# Patient Record
Sex: Female | Born: 1980 | Race: White | Hispanic: No | Marital: Single | Smoking: Never smoker
Health system: Southern US, Community
[De-identification: ages and names within clinical notes are randomized; demographics above are authoritative.]

## PROBLEM LIST (undated history)

## (undated) ENCOUNTER — Emergency Department (HOSPITAL_COMMUNITY): Payer: Medicaid Other

## (undated) DIAGNOSIS — O1490 Unspecified pre-eclampsia, unspecified trimester: Secondary | ICD-10-CM

## (undated) DIAGNOSIS — O24419 Gestational diabetes mellitus in pregnancy, unspecified control: Secondary | ICD-10-CM

---

## 2014-07-11 ENCOUNTER — Encounter (HOSPITAL_COMMUNITY): Payer: Self-pay | Admitting: *Deleted

## 2014-07-11 ENCOUNTER — Emergency Department (HOSPITAL_COMMUNITY)
Admission: EM | Admit: 2014-07-11 | Discharge: 2014-07-11 | Disposition: A | Discharge status: Home / Self Care | Payer: Medicaid - Out of State | Attending: Emergency Medicine | Admitting: Emergency Medicine

## 2014-07-11 DIAGNOSIS — K006 Disturbances in tooth eruption: Secondary | ICD-10-CM | POA: Diagnosis not present

## 2014-07-11 DIAGNOSIS — K088 Other specified disorders of teeth and supporting structures: Secondary | ICD-10-CM | POA: Insufficient documentation

## 2014-07-11 DIAGNOSIS — K0889 Other specified disorders of teeth and supporting structures: Secondary | ICD-10-CM

## 2014-07-11 MED ORDER — TRAMADOL HCL 50 MG PO TABS: 50.0000 mg | ORAL_TABLET | Freq: Four times a day (QID) | ORAL | Status: DC | PRN

## 2014-07-11 MED ORDER — TRAMADOL HCL 50 MG PO TABS
50.0000 mg | ORAL_TABLET | Freq: Once | ORAL | Status: AC
  Administered 2014-07-11: 50 mg via ORAL
  Filled 2014-07-11: qty 1

## 2014-07-11 MED ORDER — AMOXICILLIN 500 MG PO CAPS: 500.0000 mg | ORAL_CAPSULE | Freq: Three times a day (TID) | ORAL | Status: AC

## 2014-07-11 NOTE — Discharge Instructions (Signed)
Dental Pain Toothache is pain in or around a tooth. It may get worse with chewing or with cold or heat.  HOME CARE  Your dentist may use a numbing medicine during treatment. If so, you may need to avoid eating until the medicine wears off. Ask your dentist about this.  Only take medicine as told by your dentist or doctor.  Avoid chewing food near the painful tooth until after all treatment is done. Ask your dentist about this. GET HELP RIGHT AWAY IF:   The problem gets worse or new problems appear.  You have a fever.  There is redness and puffiness (swelling) of the face, jaw, or neck.  You cannot open your mouth.  There is pain in the jaw.  There is very bad pain that is not helped by medicine. MAKE SURE YOU:   Understand these instructions.  Will watch your condition.  Will get help right away if you are not doing well or get worse. Document Released: 07/24/2007 Document Revised: 04/29/2011 Document Reviewed: 07/24/2007 [facility] Patient Information 2015 [place], [place]. This information is not intended to replace advice given to you by your health care provider. Make sure you discuss any questions you have with your health care provider.   As discussed there is no clear-cut infection as the source of your dental pain today.  Use the tramadol for pain relief.  If you develop increased pain especially associated with gingival swelling or drainage as discussed, get the antibiotic prescription filled as well.  See her oral surgeon as soon as possible for further management of your pain.

## 2014-07-11 NOTE — ED Notes (Signed)
Pt c/o right side dental pain that became worse a few days ago, has appointment with oral surgeon 08-11-2014

## 2014-07-11 NOTE — ED Provider Notes (Signed)
CSN: [id number]     Arrival date & time 07/11/14  1429 History   First MD Initiated Contact with Patient 07/11/14 1603     Chief Complaint  Patient presents with  . Dental Pain     (Consider location/radiation/quality/duration/timing/severity/associated sxs/prior Treatment) The history is provided by the patient.   Barbara Thompson is a 34 y.o. female presenting with a 3 day history of dental pain without gingival swelling.   The patient has a history of intermittent pain at the dental site and is [place] (here assisting family member).   There has been no fevers, chills, nausea or vomiting, also no complaint of difficulty swallowing, although chewing makes pain worse.  The patient has tried oragel and ibuprofen without relief of symptoms.        History reviewed. No pertinent past medical history. Past Surgical History  Procedure Laterality Date  . Cesarean section     No family history on file. History  Substance Use Topics  . Smoking status: Never Smoker   . Smokeless tobacco: Not on file  . Alcohol Use: No   OB History    No data available     Review of Systems  Constitutional: Negative for fever.  HENT: Positive for dental problem. Negative for facial swelling and sore throat.   Respiratory: Negative for shortness of breath.   Musculoskeletal: Negative for neck pain and neck stiffness.      Allergies  Review of patient's allergies indicates no known allergies.  Home Medications   Prior to Admission medications   Medication Sig Start Date End Date Taking? Authorizing Provider  benzocaine (ORAJEL) 10 % mucosal gel Use as directed 1 application in the mouth or throat as needed for mouth pain.   Yes Historical Provider, MD  ibuprofen (ADVIL,MOTRIN) 200 MG tablet Take 200 mg by mouth every 6 (six) hours as needed for mild pain or moderate pain.   Yes Historical Provider, MD  amoxicillin (AMOXIL) 500 MG capsule Take 1  capsule (500 mg total) by mouth 3 (three) times daily. 07/11/14 07/21/14  Burgess Amor, PA-C  traMADol (ULTRAM) 50 MG tablet Take 1 tablet (50 mg total) by mouth every 6 (six) hours as needed. 07/11/14   Burgess Amor, PA-C   BP 124/80 mmHg  Pulse 105  Temp(Src) 97.6 F (36.4 C) (Oral)  Resp 18  Ht  (1.549 m)  Wt 230 lb (104.327 kg)  BMI 43.48 kg/m2  SpO2 99% Physical Exam  Constitutional: She is oriented to person, place, and time. She appears well-developed and well-nourished. No distress.  HENT:  Head: Normocephalic and atraumatic.  Right Ear: Tympanic membrane and external ear normal.  Left Ear: Tympanic membrane and external ear normal.  Mouth/Throat: Oropharynx is clear and moist and mucous membranes are normal. No oral lesions. No trismus in the jaw. Abnormal dentition. No dental abscesses.  Patient has several areas of dental extractions involving molars.  Her right upper second molar is painful on percussion.  There is an old appearing filling in this tooth, no obvious abscess or dental decay, although I suspect possible filling failure at this site.  Eyes: Conjunctivae are normal.  Neck: Normal range of motion. Neck supple.  Cardiovascular: Normal rate and normal heart sounds.   Pulmonary/Chest: Effort normal.  Abdominal: She exhibits no distension.  Musculoskeletal: Normal range of motion.  Lymphadenopathy:    She has no cervical adenopathy.  Neurological: She is alert and oriented to person,  place, and time.  Skin: Skin is warm and dry. No erythema.  Psychiatric: She has a normal mood and affect.    ED Course  Procedures (including critical care time) Labs Review Labs Reviewed - No data to display  Imaging Review No results found.   EKG Interpretation None      MDM   Final diagnoses:  Pain, dental    Patient was prescribed tramadol for pain relief.  I'm not convinced she has an active infection at this tooth site, however she was prescribed amoxicillin and  advised to just hold this prescription and get it filled if her pain persists and she develops gingival swelling at the site.  She reports history of prior dental abscesses and agrees this does not feel like an abscess today.  She is planning follow-up with her oral surgeon for next week.  When necessary follow-up anticipated here.    Burgess AmorJulie Jasleen Riepe, PA-C 07/11/14 1802  Gilda Creasehristopher J Pollina, MD 07/11/14 [phone]

## 2018-08-12 ENCOUNTER — Emergency Department (HOSPITAL_COMMUNITY)
Admission: EM | Admit: 2018-08-12 | Discharge: 2018-08-12 | Disposition: A | Discharge status: Home / Self Care | Payer: Medicaid - Out of State | Attending: Emergency Medicine | Admitting: Emergency Medicine

## 2018-08-12 ENCOUNTER — Other Ambulatory Visit: Payer: Self-pay

## 2018-08-12 ENCOUNTER — Encounter (HOSPITAL_COMMUNITY): Payer: Self-pay | Admitting: Emergency Medicine

## 2018-08-12 ENCOUNTER — Emergency Department (HOSPITAL_COMMUNITY): Payer: Medicaid - Out of State

## 2018-08-12 DIAGNOSIS — R1032 Left lower quadrant pain: Secondary | ICD-10-CM | POA: Diagnosis not present

## 2018-08-12 DIAGNOSIS — Z3490 Encounter for supervision of normal pregnancy, unspecified, unspecified trimester: Secondary | ICD-10-CM

## 2018-08-12 DIAGNOSIS — R102 Pelvic and perineal pain: Secondary | ICD-10-CM | POA: Diagnosis not present

## 2018-08-12 DIAGNOSIS — Z79899 Other long term (current) drug therapy: Secondary | ICD-10-CM | POA: Diagnosis not present

## 2018-08-12 DIAGNOSIS — N3000 Acute cystitis without hematuria: Secondary | ICD-10-CM

## 2018-08-12 DIAGNOSIS — O2341 Unspecified infection of urinary tract in pregnancy, first trimester: Secondary | ICD-10-CM | POA: Insufficient documentation

## 2018-08-12 DIAGNOSIS — O9989 Other specified diseases and conditions complicating pregnancy, childbirth and the puerperium: Secondary | ICD-10-CM | POA: Insufficient documentation

## 2018-08-12 DIAGNOSIS — R109 Unspecified abdominal pain: Secondary | ICD-10-CM

## 2018-08-12 LAB — CBC WITH DIFFERENTIAL/PLATELET
Abs Immature Granulocytes: 0.03 10*3/uL (ref 0.00–0.07)
Basophils Absolute: 0 10*3/uL (ref 0.0–0.1)
Basophils Relative: 0 %
Eosinophils Absolute: 0.1 10*3/uL (ref 0.0–0.5)
Eosinophils Relative: 1 %
HCT: 38.3 % (ref 36.0–46.0)
Hemoglobin: 12.3 g/dL (ref 12.0–15.0)
Immature Granulocytes: 0 %
Lymphocytes Relative: 24 %
Lymphs Abs: 2.1 10*3/uL (ref 0.7–4.0)
MCH: 26.5 pg (ref 26.0–34.0)
MCHC: 32.1 g/dL (ref 30.0–36.0)
MCV: 82.4 fL (ref 80.0–100.0)
Monocytes Absolute: 0.5 10*3/uL (ref 0.1–1.0)
Monocytes Relative: 6 %
Neutro Abs: 5.9 10*3/uL (ref 1.7–7.7)
Neutrophils Relative %: 69 %
Platelets: 178 10*3/uL (ref 150–400)
RBC: 4.65 MIL/uL (ref 3.87–5.11)
RDW: 14.2 % (ref 11.5–15.5)
WBC: 8.6 10*3/uL (ref 4.0–10.5)
nRBC: 0 % (ref 0.0–0.2)

## 2018-08-12 LAB — URINALYSIS, ROUTINE W REFLEX MICROSCOPIC
Bilirubin Urine: NEGATIVE
Glucose, UA: NEGATIVE mg/dL
Hgb urine dipstick: NEGATIVE
Ketones, ur: NEGATIVE mg/dL
Nitrite: NEGATIVE
Protein, ur: NEGATIVE mg/dL
Specific Gravity, Urine: 1.023 (ref 1.005–1.030)
pH: 5 (ref 5.0–8.0)

## 2018-08-12 LAB — COMPREHENSIVE METABOLIC PANEL
ALT: 13 U/L (ref 0–44)
AST: 12 U/L — ABNORMAL LOW (ref 15–41)
Albumin: 3.5 g/dL (ref 3.5–5.0)
Alkaline Phosphatase: 55 U/L (ref 38–126)
Anion gap: 9 (ref 5–15)
BUN: 12 mg/dL (ref 6–20)
CO2: 23 mmol/L (ref 22–32)
Calcium: 8.7 mg/dL — ABNORMAL LOW (ref 8.9–10.3)
Chloride: 106 mmol/L (ref 98–111)
Creatinine, Ser: 0.62 mg/dL (ref 0.44–1.00)
GFR calc Af Amer: >60 mL/min (ref >60)
GFR calc non Af Amer: >60 mL/min (ref >60)
Glucose, Bld: 110 mg/dL — ABNORMAL HIGH (ref 70–99)
Potassium: 3.7 mmol/L (ref 3.5–5.1)
Sodium: 138 mmol/L (ref 135–145)
Total Bilirubin: 0.5 mg/dL (ref 0.3–1.2)
Total Protein: 6.1 g/dL — ABNORMAL LOW (ref 6.5–8.1)

## 2018-08-12 LAB — HCG, QUANTITATIVE, PREGNANCY: hCG, Beta Chain, Quant, S: 1621 m[IU]/mL — ABNORMAL HIGH (ref <5)

## 2018-08-12 LAB — ABO/RH: ABO/RH(D): A NEG

## 2018-08-12 LAB — PREGNANCY, URINE: Preg Test, Ur: POSITIVE — AB

## 2018-08-12 MED ORDER — CEPHALEXIN 500 MG PO CAPS: 500.0000 mg | ORAL_CAPSULE | Freq: Two times a day (BID) | ORAL | 0 refills | Status: DC

## 2018-08-12 NOTE — ED Provider Notes (Signed)
[facility] Provider Note   CSN: [id number] Arrival date & time: 08/12/18  [id number]    History   Chief Complaint Chief Complaint  Patient presents with  . Abdominal Pain    HPI Ewing SchleinSherry Knoedler is a 38 y.o. female.     Patient with history of preeclampsia and gestational diabetes, C-section history presents with lower abdominal cramping worse left lower for 1 day.  Patient has been trying to get pregnant and took a home pregnancy test that was positive.  Patient denies any vaginal discharge vaginal bleeding or concerns for STDs.  Patient denies any fevers or respiratory symptoms.  Patient has no ectopic pregnancy history.  Patient denies urinary symptoms.     History reviewed. No pertinent past medical history.  There are no active problems to display for this patient.   Past Surgical History:  Procedure Laterality Date  . CESAREAN SECTION       OB History    Gravida  1   Para      Term      Preterm      AB      Living        SAB      TAB      Ectopic      Multiple      Live Births               Home Medications    Prior to Admission medications   Medication Sig Start Date End Date Taking? Authorizing Provider  Prenatal Vit-Fe Fumarate-FA (MULTIVITAMIN-PRENATAL) 27-0.8 MG TABS tablet Take 1 tablet by mouth daily at 12 noon.   Yes [provider]  cephALEXin (KEFLEX) 500 MG capsule Take 1 capsule (500 mg total) by mouth 2 (two) times daily. 08/12/18   Blane OharaZavitz, Maki Hege, MD  traMADol (ULTRAM) 50 MG tablet Take 1 tablet (50 mg total) by mouth every 6 (six) hours as needed. Patient not taking: Reported on 08/12/2018 07/11/14   Burgess AmorIdol, Julie, PA-C    Family History No family history on file.  Social History Social History   Tobacco Use  . Smoking status: Never Smoker  . Smokeless tobacco: Never Used  Substance Use Topics  . Alcohol use: No  . Drug use: No     Allergies   Patient has no known allergies.   Review of  Systems Review of Systems  Constitutional: Positive for appetite change. Negative for chills and fever.  HENT: Negative for congestion.   Eyes: Negative for visual disturbance.  Respiratory: Negative for shortness of breath.   Cardiovascular: Negative for chest pain.  Gastrointestinal: Positive for abdominal pain. Negative for vomiting.  Genitourinary: Negative for dysuria and flank pain.  Musculoskeletal: Negative for back pain, neck pain and neck stiffness.  Skin: Negative for rash.  Neurological: Negative for light-headedness and headaches.     Physical Exam Updated Vital Signs BP (!) 90/51   Pulse (!) 55   Temp 97.6 F (36.4 C) (Oral)   Ht 5\' 1"  (1.549 m)   Wt 98.9 kg   SpO2 100%   BMI 41.19 kg/m   Physical Exam Vitals signs and nursing note reviewed.  Constitutional:      Appearance: She is well-developed.  HENT:     Head: Normocephalic and atraumatic.  Eyes:     General:        Right eye: No discharge.        Left eye: No discharge.     Conjunctiva/sclera: Conjunctivae normal.  Neck:     Musculoskeletal: Normal range of motion and neck supple.     Trachea: No tracheal deviation.  Cardiovascular:     Rate and Rhythm: Normal rate and regular rhythm.  Pulmonary:     Effort: Pulmonary effort is normal.     Breath sounds: Normal breath sounds.  Abdominal:     General: There is no distension.     Palpations: Abdomen is soft.     Tenderness: There is no abdominal tenderness. There is no guarding.  Skin:    General: Skin is warm.     Findings: No rash.  Neurological:     Mental Status: She is alert and oriented to person, place, and time.      ED Treatments / Results  Labs (all labs ordered are listed, but only abnormal results are displayed) Labs Reviewed  HCG, QUANTITATIVE, PREGNANCY - Abnormal; Notable for the following components:      Result Value   hCG, Beta Chain, Quant, S 1,621 (*)    All other components within normal limits  URINALYSIS,  ROUTINE W REFLEX MICROSCOPIC - Abnormal; Notable for the following components:   Leukocytes,Ua TRACE (*)    Bacteria, UA RARE (*)    All other components within normal limits  COMPREHENSIVE METABOLIC PANEL - Abnormal; Notable for the following components:   Glucose, Bld 110 (*)    Calcium 8.7 (*)    Total Protein 6.1 (*)    AST 12 (*)    All other components within normal limits  PREGNANCY, URINE - Abnormal; Notable for the following components:   Preg Test, Ur POSITIVE (*)    All other components within normal limits  CBC WITH DIFFERENTIAL/PLATELET  ABO/RH    EKG None  Radiology US Ob Comp < 14 Wks  Result Date: 08/12/2018 CLINICAL DATA:  Pelvic pain for 1 day EXAM: OBSTETRIC <14 WK [place] AND TRANSVAGINAL OB US TECHNIQUE: Both transabdominal and transvaginal ultrasound examinations were performed for complete evaluation of the gestation as well as the maternal uterus, adnexal regions, and pelvic cul-de-sac. Transvaginal technique was performed to assess early pregnancy. COMPARISON:  None. FINDINGS: Intrauterine gestational sac: None Yolk sac:  Not Visualized. Embryo:  Not Visualized. Cardiac Activity: Not Visualized. Subchorionic hemorrhage:  None visualized. Maternal uterus/adnexae: No adnexal mass. Normal bilateral ovaries. Normal uterus. No pelvic free fluid. IMPRESSION: No intrauterine pregnancy identified. No beta hCG is available for correlation. If the beta hCG is elevated, differential diagnosis includes pregnancy too early to detect versus missed abortion versus ectopic pregnancy. Recommend clinical correlation, serial quantitative beta HCGs, ectopic precautions, and followup ultrasound as clinically indicated. Electronically Signed   By: Kathreen Devoid   On: 08/12/2018 09:45   US Ob Transvaginal  Result Date: 08/12/2018 CLINICAL DATA:  Pelvic pain for 1 day EXAM: OBSTETRIC <14 WK [place] AND TRANSVAGINAL OB US TECHNIQUE: Both transabdominal and transvaginal ultrasound examinations were  performed for complete evaluation of the gestation as well as the maternal uterus, adnexal regions, and pelvic cul-de-sac. Transvaginal technique was performed to assess early pregnancy. COMPARISON:  None. FINDINGS: Intrauterine gestational sac: None Yolk sac:  Not Visualized. Embryo:  Not Visualized. Cardiac Activity: Not Visualized. Subchorionic hemorrhage:  None visualized. Maternal uterus/adnexae: No adnexal mass. Normal bilateral ovaries. Normal uterus. No pelvic free fluid. IMPRESSION: No intrauterine pregnancy identified. No beta hCG is available for correlation. If the beta hCG is elevated, differential diagnosis includes pregnancy too early to detect versus missed abortion versus ectopic pregnancy. Recommend clinical correlation, serial quantitative  beta HCGs, ectopic precautions, and followup ultrasound as clinically indicated. Electronically Signed   By: Elige KoHetal  Patel   On: 08/12/2018 09:45    Procedures Procedures (including critical care time)  Medications Ordered in ED Medications - No data to display   Initial Impression / Assessment and Plan / ED Course  I have reviewed the triage vital signs and the nursing notes.  Pertinent labs & imaging results that were available during my care of the patient were reviewed by me and considered in my medical decision making (see chart for details).       Patient presents with abdominal cramping early in pregnancy.  Unknown dating of pregnancy and with age and discomfort plan for ultrasound to confirm IUP.  Blood work sent, urinalysis pending.  Ultrasound reviewed no definitive intrauterine pregnancy.  Beta hCG low 1621.  Concern for very early pregnancy versus ectopic.  Discussed with Dr. Despina HiddenEure who will see the patient on Monday.  Specific reasons to return follow-up discussed with patient.  Urinalysis possible early infection culture sent to plan for oral antibiotics. Final Clinical Impressions(s) / ED Diagnoses   Final diagnoses:  Early  stage of pregnancy  Abdominal cramping  Acute cystitis without hematuria    ED Discharge Orders         Ordered    cephALEXin (KEFLEX) 500 MG capsule  2 times daily     08/12/18 1113           Blane OharaZavitz, Aseel [place], MD 08/12/18 1115

## 2018-08-12 NOTE — Discharge Instructions (Addendum)
Follow-up with OB on Monday for repeat blood testing and reassessment. If you develop vaginal bleeding, worsening pain, passout or other concerns return to the emergency department. Take Tylenol as needed every 4-6 hours for pain.  Take antibiotics until urine culture result.

## 2018-08-12 NOTE — ED Notes (Signed)
Pt refused d/c vitals. Pt urgent to leave to go check on daughter.

## 2018-08-12 NOTE — ED Triage Notes (Addendum)
Pt c/o of lower abdominal pain x 1 day, denies n/v/d or urinary s/s. Pt is currently pregnant.  Doesn't know how far along

## 2018-08-13 LAB — URINE CULTURE

## 2018-11-17 ENCOUNTER — Other Ambulatory Visit: Payer: Self-pay

## 2018-11-17 ENCOUNTER — Emergency Department (HOSPITAL_COMMUNITY)
Admission: EM | Admit: 2018-11-17 | Discharge: 2018-11-17 | Disposition: A | Discharge status: Home / Self Care | Payer: Medicaid - Out of State | Attending: Emergency Medicine | Admitting: Emergency Medicine

## 2018-11-17 ENCOUNTER — Encounter (HOSPITAL_COMMUNITY): Payer: Self-pay | Admitting: *Deleted

## 2018-11-17 DIAGNOSIS — Z3A18 18 weeks gestation of pregnancy: Secondary | ICD-10-CM | POA: Insufficient documentation

## 2018-11-17 DIAGNOSIS — O26892 Other specified pregnancy related conditions, second trimester: Secondary | ICD-10-CM | POA: Insufficient documentation

## 2018-11-17 DIAGNOSIS — K429 Umbilical hernia without obstruction or gangrene: Secondary | ICD-10-CM | POA: Insufficient documentation

## 2018-11-17 DIAGNOSIS — R1032 Left lower quadrant pain: Secondary | ICD-10-CM

## 2018-11-17 HISTORY — DX: Gestational diabetes mellitus in pregnancy, unspecified control: O24.419

## 2018-11-17 HISTORY — DX: Unspecified pre-eclampsia, unspecified trimester: O14.90

## 2018-11-17 MED ORDER — LACTATED RINGERS IV BOLUS: 1000.0000 mL | Freq: Once | INTRAVENOUS | Status: DC

## 2018-11-17 NOTE — ED Notes (Signed)
Pt stated she had to leave due to her mother being rushed to [facility]

## 2018-11-17 NOTE — ED Triage Notes (Signed)
Pt c/o abdominal pain and cramping x 3 days; pt states she has an umbilical hernia that she is usually able to push back in but since she has been pregnant she has been having difficulty with pushing it in; pt states the pain has been keeping her from sleeping at night

## 2018-11-17 NOTE — ED Provider Notes (Signed)
[facility] EMERGENCY DEPARTMENT Provider Note   CSN: [id number] Arrival date & time: 11/17/18  0214     History   Chief Complaint Chief Complaint  Patient presents with  . Abdominal Pain    HPI Barbara Thompson is a 38 y.o. female.     Patient is a periumbilical hernia that has been more difficult to reduce and slightly more painful than previously.  She still has normal bowel movements and no vomiting.  She also has some lower abdominal cramping and increasing urinary frequency.  She states that she had preeclampsia and gestational diabetes in her last pregnancy.  She is currently [redacted] weeks pregnant.  Still feels the baby move.  No vaginal leakage.  No other associated symptoms.  The history is provided by the patient.  Abdominal Pain Pain location:  Periumbilical, LLQ and RLQ   Past Medical History:  Diagnosis Date  . Gestational diabetes   . Preeclampsia     There are no active problems to display for this patient.   Past Surgical History:  Procedure Laterality Date  . CESAREAN SECTION       OB History    Gravida  1   Para      Term      Preterm      AB      Living        SAB      TAB      Ectopic      Multiple      Live Births               Home Medications    Prior to Admission medications   Medication Sig Start Date End Date Taking? Authorizing Provider  cephALEXin (KEFLEX) 500 MG capsule Take 1 capsule (500 mg total) by mouth 2 (two) times daily. 08/12/18   Blane Ohara, MD  Prenatal Vit-Fe Fumarate-FA (MULTIVITAMIN-PRENATAL) 27-0.8 MG TABS tablet Take 1 tablet by mouth daily at 12 noon.    [provider]  traMADol (ULTRAM) 50 MG tablet Take 1 tablet (50 mg total) by mouth every 6 (six) hours as needed. Patient not taking: Reported on 08/12/2018 07/11/14   Burgess Amor, PA-C    Family History History reviewed. No pertinent family history.  Social History Social History   Tobacco Use  . Smoking status: Never Smoker  .  Smokeless tobacco: Never Used  Substance Use Topics  . Alcohol use: No  . Drug use: No     Allergies   Patient has no known allergies.   Review of Systems Review of Systems  Gastrointestinal: Positive for abdominal pain.  All other systems reviewed and are negative.    Physical Exam Updated Vital Signs BP 123/70 (BP Location: Left Arm)   Pulse (!) 103   Temp 97.9 F (36.6 C) (Oral)   Resp 18   Ht 5\' 1"  (1.549 m)   Wt 97.5 kg   SpO2 99%   BMI 40.62 kg/m   Physical Exam Vitals signs and nursing note reviewed.  Constitutional:      Appearance: She is well-developed.  HENT:     Head: Normocephalic and atraumatic.     Nose: No congestion or rhinorrhea.     Mouth/Throat:     Pharynx: Oropharynx is clear.  Eyes:     Pupils: Pupils are equal, round, and reactive to light.  Neck:     Musculoskeletal: Normal range of motion.  Cardiovascular:     Rate and Rhythm: Normal rate and  regular rhythm.     Pulses: Normal pulses.  Pulmonary:     Effort: No respiratory distress.     Breath sounds: No stridor.  Abdominal:     General: There is no distension.     Tenderness: There is no abdominal tenderness.     Hernia: A hernia (easily reducible, minimally tender periumbilcal hernia) is present.  Musculoskeletal: Normal range of motion.  Skin:    General: Skin is warm and dry.  Neurological:     General: No focal deficit present.     Mental Status: She is alert.      ED Treatments / Results  Labs (all labs ordered are listed, but only abnormal results are displayed) Labs Reviewed  URINE CULTURE  URINALYSIS, ROUTINE W REFLEX MICROSCOPIC  CBC WITH DIFFERENTIAL/PLATELET    EKG None  Radiology No results found.  Procedures Procedures (including critical care time)  Medications Ordered in ED Medications  lactated ringers bolus 1,000 mL (has no administration in time range)     Initial Impression / Assessment and Plan / ED Course  I have reviewed the  triage vital signs and the nursing notes.  Pertinent labs & imaging results that were available during my care of the patient were reviewed by me and considered in my medical decision making (see chart for details).        Low suspicion for impending fetal demise.  Strong heart rate, good fetal movement per the mother and no vaginal symptoms.  Periumbilical hernia is not have any evidence of incarceration or pending bowel obstruction.  Her lower abdominal pain could be related to a UTI which she is had the past.  Her polyuria also could be related to diabetes.  Plan to check a urine, hCG and a BMP for the same.  Informed by nursing that patient left without telling anyone.  Final Clinical Impressions(s) / ED Diagnoses   Final diagnoses:  None    ED Discharge Orders    None       Laquan Ludden, Corene Cornea, MD 11/17/18 [phone]

## 2020-07-05 ENCOUNTER — Emergency Department (HOSPITAL_COMMUNITY): Payer: Medicaid Other

## 2020-07-05 ENCOUNTER — Emergency Department (HOSPITAL_COMMUNITY)
Admission: EM | Admit: 2020-07-05 | Discharge: 2020-07-05 | Disposition: A | Discharge status: Home / Self Care | Payer: Medicaid Other | Attending: Emergency Medicine | Admitting: Emergency Medicine

## 2020-07-05 ENCOUNTER — Other Ambulatory Visit: Payer: Self-pay

## 2020-07-05 ENCOUNTER — Encounter (HOSPITAL_COMMUNITY): Payer: Self-pay | Admitting: Emergency Medicine

## 2020-07-05 DIAGNOSIS — R110 Nausea: Secondary | ICD-10-CM | POA: Insufficient documentation

## 2020-07-05 DIAGNOSIS — R109 Unspecified abdominal pain: Secondary | ICD-10-CM | POA: Diagnosis present

## 2020-07-05 DIAGNOSIS — R300 Dysuria: Secondary | ICD-10-CM | POA: Diagnosis not present

## 2020-07-05 DIAGNOSIS — R10A0 Flank pain, unspecified side: Secondary | ICD-10-CM

## 2020-07-05 LAB — CBC WITH DIFFERENTIAL/PLATELET
Abs Immature Granulocytes: 0.02 10*3/uL (ref 0.00–0.07)
Basophils Absolute: 0 10*3/uL (ref 0.0–0.1)
Basophils Relative: 0 %
Eosinophils Absolute: 0 10*3/uL (ref 0.0–0.5)
Eosinophils Relative: 0 %
HCT: 40.2 % (ref 36.0–46.0)
Hemoglobin: 12.8 g/dL (ref 12.0–15.0)
Immature Granulocytes: 0 %
Lymphocytes Relative: 31 %
Lymphs Abs: 2 10*3/uL (ref 0.7–4.0)
MCH: 27.1 pg (ref 26.0–34.0)
MCHC: 31.8 g/dL (ref 30.0–36.0)
MCV: 85 fL (ref 80.0–100.0)
Monocytes Absolute: 0.4 10*3/uL (ref 0.1–1.0)
Monocytes Relative: 6 %
Neutro Abs: 4 10*3/uL (ref 1.7–7.7)
Neutrophils Relative %: 63 %
Platelets: 194 10*3/uL (ref 150–400)
RBC: 4.73 MIL/uL (ref 3.87–5.11)
RDW: 16.8 % — ABNORMAL HIGH (ref 11.5–15.5)
WBC: 6.4 10*3/uL (ref 4.0–10.5)
nRBC: 0 % (ref 0.0–0.2)

## 2020-07-05 LAB — URINALYSIS, ROUTINE W REFLEX MICROSCOPIC
Bilirubin Urine: NEGATIVE
Glucose, UA: NEGATIVE mg/dL
Ketones, ur: NEGATIVE mg/dL
Leukocytes,Ua: NEGATIVE
Nitrite: POSITIVE — AB
Protein, ur: 30 mg/dL — AB
Specific Gravity, Urine: 1.019 (ref 1.005–1.030)
pH: 5 (ref 5.0–8.0)

## 2020-07-05 LAB — BASIC METABOLIC PANEL
Anion gap: 9 (ref 5–15)
BUN: 8 mg/dL (ref 6–20)
CO2: 25 mmol/L (ref 22–32)
Calcium: 8.9 mg/dL (ref 8.9–10.3)
Chloride: 100 mmol/L (ref 98–111)
Creatinine, Ser: 0.56 mg/dL (ref 0.44–1.00)
GFR, Estimated: >60 mL/min (ref >60)
Glucose, Bld: 110 mg/dL — ABNORMAL HIGH (ref 70–99)
Potassium: 3.6 mmol/L (ref 3.5–5.1)
Sodium: 134 mmol/L — ABNORMAL LOW (ref 135–145)

## 2020-07-05 LAB — PREGNANCY, URINE: Preg Test, Ur: NEGATIVE

## 2020-07-05 MED ORDER — SODIUM CHLORIDE 0.9 % IV BOLUS
1000.0000 mL | Freq: Once | INTRAVENOUS | Status: AC
  Administered 2020-07-05: 1000 mL via INTRAVENOUS

## 2020-07-05 MED ORDER — MORPHINE SULFATE (PF) 4 MG/ML IV SOLN
4.0000 mg | Freq: Once | INTRAVENOUS | Status: AC
  Administered 2020-07-05: 4 mg via INTRAVENOUS
  Filled 2020-07-05: qty 1

## 2020-07-05 MED ORDER — ONDANSETRON HCL 4 MG/2ML IJ SOLN
4.0000 mg | Freq: Once | INTRAMUSCULAR | Status: AC
  Administered 2020-07-05: 4 mg via INTRAVENOUS
  Filled 2020-07-05: qty 2

## 2020-07-05 MED ORDER — KETOROLAC TROMETHAMINE 30 MG/ML IJ SOLN
15.0000 mg | Freq: Once | INTRAMUSCULAR | Status: AC
  Administered 2020-07-05: 15 mg via INTRAVENOUS
  Filled 2020-07-05: qty 1

## 2020-07-05 MED ORDER — CEPHALEXIN 500 MG PO CAPS: 500.0000 mg | ORAL_CAPSULE | Freq: Two times a day (BID) | ORAL | 0 refills | Status: AC

## 2020-07-05 NOTE — ED Provider Notes (Signed)
Care of the patient assumed at the change of shift. Here for R flank pain, possible kidney stone. Pending labs and CT.  Physical Exam  BP (!) 155/92   Pulse (!) 102   Temp 97.9 F (36.6 C) (Oral)   Resp 18   Ht 5\' 1"  (1.549 m)   Wt 93 kg   LMP 06/25/2020   SpO2 98%   Breastfeeding Unknown   BMI 38.73 kg/m   Physical Exam Awake and alert No distress Preoccupied with finding her phone ED Course/Procedures   Clinical Course as of 07/05/20 1055  Wed Jul 05, 2020  0730 CBC is normal.  [CS]  0735 HCG is neg. BMP is normal.  [CS]  0739 UA with small blood, nitrite positive. No other definite signs of infection although patient reports burning with urination and frequency. Will send for culture and treat for cystitis.  [CS]  1030 CT report is not in Epic, but from PACS: IMPRESSION: 1. Possible tiny nonobstructing right renal calculus. No evidence of ureteral calculus or hydronephrosis. 2. No apparent acute abdominal findings. 3. Moderate-sized umbilical hernia containing only fat. Lumbar facet arthropathy. [CS]    Clinical Course User Index [CS] Jul 07, 2020, MD    Procedures  MDM        Pollyann Savoy, MD 07/05/20 1055

## 2020-07-05 NOTE — ED Provider Notes (Signed)
[facility] EMERGENCY DEPARTMENT Provider Note   CSN: [id number] Arrival date & time: 07/05/20  0601     History Chief Complaint  Patient presents with  . Flank Pain    Barbara Thompson is a 40 y.o. female.  HPI     This a 40 year old female who presents with right-sided flank pain.  Patient reports 2 to 3-day history of worsening intermittent right-sided flank pain that radiates into the right abdomen.  She states it has progressively gotten worse.  She has also noted dark-colored urine in the last day.  She states that she stayed up most of the evening with her pain.  She did not take anything for her symptoms except for an Azo.  She is not had any fever.  She has had some slight dysuria.  She reports nausea without vomiting.  She reports "I may have had a kidney stone before" but she states that she feels that this is worse.  She rates her pain at 6 out of 10.  Past Medical History:  Diagnosis Date  . Gestational diabetes   . Preeclampsia     There are no problems to display for this patient.   Past Surgical History:  Procedure Laterality Date  . CESAREAN SECTION       OB History    Gravida  1   Para      Term      Preterm      AB      Living        SAB      IAB      Ectopic      Multiple      Live Births              No family history on file.  Social History   Tobacco Use  . Smoking status: Never Smoker  . Smokeless tobacco: Never Used  Vaping Use  . Vaping Use: Never used  Substance Use Topics  . Alcohol use: No    Comment: occ  . Drug use: No    Home Medications Prior to Admission medications   Medication Sig Start Date End Date Taking? Authorizing Provider  cephALEXin (KEFLEX) 500 MG capsule Take 1 capsule (500 mg total) by mouth 2 (two) times daily. 08/12/18   Blane Ohara, MD  Prenatal Vit-Fe Fumarate-FA (MULTIVITAMIN-PRENATAL) 27-0.8 MG TABS tablet Take 1 tablet by mouth daily at 12 noon.    [provider]   traMADol (ULTRAM) 50 MG tablet Take 1 tablet (50 mg total) by mouth every 6 (six) hours as needed. Patient not taking: Reported on 08/12/2018 07/11/14   Burgess Amor, PA-C    Allergies    Patient has no known allergies.  Review of Systems   Review of Systems  Constitutional: Negative for fever.  Respiratory: Negative for shortness of breath.   Cardiovascular: Negative for chest pain.  Gastrointestinal: Positive for nausea. Negative for abdominal pain and vomiting.  Genitourinary: Positive for dysuria and flank pain.  All other systems reviewed and are negative.   Physical Exam Updated Vital Signs BP (!) 155/92   Pulse (!) 102   Temp 98.2 F (36.8 C)   Resp 18   Ht 1.549 m (5\' 1" )   Wt 93 kg   LMP 06/25/2020   SpO2 98%   Breastfeeding Unknown   BMI 38.73 kg/m   Physical Exam Vitals and nursing note reviewed.  Constitutional:      Appearance: She is well-developed. She is obese.  She is not ill-appearing.  HENT:     Head: Normocephalic and atraumatic.     Nose: Nose normal.     Mouth/Throat:     Mouth: Mucous membranes are moist.  Eyes:     Pupils: Pupils are equal, round, and reactive to light.  Cardiovascular:     Rate and Rhythm: Normal rate and regular rhythm.     Heart sounds: Normal heart sounds.  Pulmonary:     Effort: Pulmonary effort is normal. No respiratory distress.     Breath sounds: No wheezing.  Abdominal:     General: Bowel sounds are normal.     Palpations: Abdomen is soft.     Tenderness: There is right CVA tenderness. There is no left CVA tenderness.  Musculoskeletal:     Cervical back: Neck supple.     Right lower leg: No edema.     Left lower leg: No edema.  Skin:    General: Skin is warm and dry.  Neurological:     Mental Status: She is alert and oriented to person, place, and time.  Psychiatric:        Mood and Affect: Mood normal.     ED Results / Procedures / Treatments   Labs (all labs ordered are listed, but only abnormal  results are displayed) Labs Reviewed  CBC WITH DIFFERENTIAL/PLATELET  BASIC METABOLIC PANEL  URINALYSIS, ROUTINE W REFLEX MICROSCOPIC  PREGNANCY, URINE    EKG None  Radiology No results found.  Procedures Procedures   Medications Ordered in ED Medications  sodium chloride 0.9 % bolus 1,000 mL (1,000 mLs Intravenous New Bag/Given 07/05/20 0649)  ketorolac (TORADOL) 30 MG/ML injection 15 mg (15 mg Intravenous Given 07/05/20 0650)  morphine 4 MG/ML injection 4 mg (4 mg Intravenous Given 07/05/20 0650)  ondansetron (ZOFRAN) injection 4 mg (4 mg Intravenous Given 07/05/20 8099)    ED Course  I have reviewed the triage vital signs and the nursing notes.  Pertinent labs & imaging results that were available during my care of the patient were reviewed by me and considered in my medical decision making (see chart for details).    MDM Rules/Calculators/A&P                          Patient presents with flank pain.  Worsening over the last few hours.  She is overall nontoxic and vital signs are notable for pulse of 102 and blood pressure 155/92.  She is afebrile.  She has some right CVA tenderness.  History is most consistent with kidney stone.  UTI is also a consideration.  Labs obtained.  Patient was given fluids, pain, and nausea medication.  There is no imaging in our system to confirm prior history of kidney stones.  Will obtain CT stone study after confirming that she is not pregnant.  Patient to be signed out to oncoming provider.   Final Clinical Impression(s) / ED Diagnoses Final diagnoses:  Flank pain    Rx / DC Orders ED Discharge Orders    None       Aedan Geimer, Mayer Masker, MD 07/05/20 [phone]

## 2020-07-05 NOTE — ED Notes (Signed)
Pt is very anxious and talkative 

## 2020-07-05 NOTE — ED Notes (Signed)
Right flank pain x 1 day, similar episode 10 years ago with a kidney stone.

## 2020-07-05 NOTE — ED Triage Notes (Signed)
Pt c/o right sided flank pain x 2 days. Pt states that her urine is a dark brown as well

## 2020-07-07 LAB — URINE CULTURE: Culture: >=100000 — AB

## 2020-09-23 ENCOUNTER — Emergency Department (HOSPITAL_COMMUNITY)
Admission: EM | Admit: 2020-09-23 | Discharge: 2020-09-23 | Disposition: A | Discharge status: Home / Self Care | Payer: Medicaid Other | Attending: Emergency Medicine | Admitting: Emergency Medicine

## 2020-09-23 ENCOUNTER — Emergency Department (HOSPITAL_COMMUNITY): Payer: Medicaid Other

## 2020-09-23 ENCOUNTER — Other Ambulatory Visit: Payer: Self-pay

## 2020-09-23 ENCOUNTER — Encounter (HOSPITAL_COMMUNITY): Payer: Self-pay | Admitting: *Deleted

## 2020-09-23 DIAGNOSIS — E871 Hypo-osmolality and hyponatremia: Secondary | ICD-10-CM | POA: Insufficient documentation

## 2020-09-23 DIAGNOSIS — M545 Low back pain, unspecified: Secondary | ICD-10-CM | POA: Diagnosis not present

## 2020-09-23 DIAGNOSIS — R109 Unspecified abdominal pain: Secondary | ICD-10-CM

## 2020-09-23 DIAGNOSIS — Z87442 Personal history of urinary calculi: Secondary | ICD-10-CM | POA: Diagnosis not present

## 2020-09-23 DIAGNOSIS — K429 Umbilical hernia without obstruction or gangrene: Secondary | ICD-10-CM | POA: Diagnosis not present

## 2020-09-23 LAB — URINALYSIS, ROUTINE W REFLEX MICROSCOPIC
Bilirubin Urine: NEGATIVE
Glucose, UA: NEGATIVE mg/dL
Hgb urine dipstick: NEGATIVE
Ketones, ur: 5 mg/dL — AB
Leukocytes,Ua: NEGATIVE
Nitrite: NEGATIVE
Protein, ur: 30 mg/dL — AB
Specific Gravity, Urine: 1.03 (ref 1.005–1.030)
pH: 5 (ref 5.0–8.0)

## 2020-09-23 LAB — COMPREHENSIVE METABOLIC PANEL
ALT: 28 U/L (ref 0–44)
AST: 64 U/L — ABNORMAL HIGH (ref 15–41)
Albumin: 4.1 g/dL (ref 3.5–5.0)
Alkaline Phosphatase: 66 U/L (ref 38–126)
Anion gap: 5 (ref 5–15)
BUN: 13 mg/dL (ref 6–20)
CO2: 26 mmol/L (ref 22–32)
Calcium: 8.4 mg/dL — ABNORMAL LOW (ref 8.9–10.3)
Chloride: 103 mmol/L (ref 98–111)
Creatinine, Ser: 0.67 mg/dL (ref 0.44–1.00)
GFR, Estimated: >60 mL/min (ref >60)
Glucose, Bld: 92 mg/dL (ref 70–99)
Potassium: 3.2 mmol/L — ABNORMAL LOW (ref 3.5–5.1)
Sodium: 134 mmol/L — ABNORMAL LOW (ref 135–145)
Total Bilirubin: 0.6 mg/dL (ref 0.3–1.2)
Total Protein: 7.4 g/dL (ref 6.5–8.1)

## 2020-09-23 LAB — CBC WITH DIFFERENTIAL/PLATELET
Abs Immature Granulocytes: 0.01 10*3/uL (ref 0.00–0.07)
Basophils Absolute: 0 10*3/uL (ref 0.0–0.1)
Basophils Relative: 0 %
Eosinophils Absolute: 0 10*3/uL (ref 0.0–0.5)
Eosinophils Relative: 0 %
HCT: 39.1 % (ref 36.0–46.0)
Hemoglobin: 13 g/dL (ref 12.0–15.0)
Immature Granulocytes: 0 %
Lymphocytes Relative: 13 %
Lymphs Abs: 0.7 10*3/uL (ref 0.7–4.0)
MCH: 26.9 pg (ref 26.0–34.0)
MCHC: 33.2 g/dL (ref 30.0–36.0)
MCV: 80.8 fL (ref 80.0–100.0)
Monocytes Absolute: 0.5 10*3/uL (ref 0.1–1.0)
Monocytes Relative: 9 %
Neutro Abs: 4.3 10*3/uL (ref 1.7–7.7)
Neutrophils Relative %: 78 %
Platelets: 153 10*3/uL (ref 150–400)
RBC: 4.84 MIL/uL (ref 3.87–5.11)
RDW: 13.6 % (ref 11.5–15.5)
WBC: 5.6 10*3/uL (ref 4.0–10.5)
nRBC: 0 % (ref 0.0–0.2)

## 2020-09-23 LAB — PREGNANCY, URINE: Preg Test, Ur: NEGATIVE

## 2020-09-23 MED ORDER — CYCLOBENZAPRINE HCL 10 MG PO TABS: 10.0000 mg | ORAL_TABLET | Freq: Two times a day (BID) | ORAL | 0 refills | Status: DC | PRN

## 2020-09-23 MED ORDER — SODIUM CHLORIDE 0.9 % IV BOLUS
500.0000 mL | Freq: Once | INTRAVENOUS | Status: AC
  Administered 2020-09-23: 500 mL via INTRAVENOUS

## 2020-09-23 MED ORDER — ONDANSETRON HCL 4 MG/2ML IJ SOLN
4.0000 mg | Freq: Once | INTRAMUSCULAR | Status: AC
  Administered 2020-09-23: 4 mg via INTRAVENOUS
  Filled 2020-09-23: qty 2

## 2020-09-23 MED ORDER — CEPHALEXIN 500 MG PO CAPS: 500.0000 mg | ORAL_CAPSULE | Freq: Two times a day (BID) | ORAL | 0 refills | Status: AC

## 2020-09-23 MED ORDER — MORPHINE SULFATE (PF) 4 MG/ML IV SOLN
4.0000 mg | Freq: Once | INTRAVENOUS | Status: AC
  Administered 2020-09-23: 4 mg via INTRAVENOUS
  Filled 2020-09-23: qty 1

## 2020-09-23 NOTE — ED Provider Notes (Signed)
[facility] EMERGENCY DEPARTMENT Provider Note   CSN: [id number] Arrival date & time: 09/23/20  1840     History Chief Complaint  Patient presents with   Flank Pain    Barbara Thompson is a 40 y.o. female.  HPI  Patient with no significant medical history presents to the emergency department with chief complaint of right-sided flank pain, patient states this started 2 days ago, states the pain came on suddenly, she describes the pain as a sharp stabbing-like sensation, will wax and wane in intensity, she states she feels it radiating into her groin, she had associated nausea without vomiting, she denies urinary symptoms like dysuria, hematuria, frequency, difficulty urination, she denies vaginal discharge, vaginal bleeding, pelvic pain, she denies any constipation or diarrhea.  She states that she has had a kidney stone in the past, states this feels similar to that, has no other significant abdominal history, she denies alleviating or aggravating factors at this time.  She does not endorse fevers, chills, chest pain, shortness of breath.  Past Medical History:  Diagnosis Date   Gestational diabetes    Preeclampsia     There are no problems to display for this patient.   Past Surgical History:  Procedure Laterality Date   CESAREAN SECTION       OB History     Gravida  1   Para      Term      Preterm      AB      Living         SAB      IAB      Ectopic      Multiple      Live Births              History reviewed. No pertinent family history.  Social History   Tobacco Use   Smoking status: Never   Smokeless tobacco: Never  Vaping Use   Vaping Use: Never used  Substance Use Topics   Alcohol use: No    Comment: occ   Drug use: No    Home Medications Prior to Admission medications   Medication Sig Start Date End Date Taking? Authorizing Provider  cephALEXin (KEFLEX) 500 MG capsule Take 1 capsule (500 mg total) by mouth 2 (two) times daily for  7 days. 09/23/20 09/30/20 Yes Carroll Sage, PA-C  cyclobenzaprine (FLEXERIL) 10 MG tablet Take 1 tablet (10 mg total) by mouth 2 (two) times daily as needed for muscle spasms. 09/23/20  Yes Carroll Sage, PA-C  diphenhydrAMINE-PSE-APAP (BENADRYL ALLERGY/COLD PO) Take 2 tablets by mouth every 6 (six) hours as needed (allergies).    [provider]  Phenazopyridine HCl (AZO DINE MAXIMUM STRENGTH PO) Take 1 capsule by mouth daily as needed (for bladder).    [provider]    Allergies    Patient has no known allergies.  Review of Systems   Review of Systems  Constitutional:  Negative for chills and fever.  HENT:  Negative for congestion.   Respiratory:  Negative for shortness of breath.   Cardiovascular:  Negative for chest pain.  Gastrointestinal:  Positive for nausea. Negative for abdominal pain and vomiting.  Genitourinary:  Positive for flank pain. Negative for dysuria, enuresis, vaginal bleeding, vaginal discharge and vaginal pain.  Musculoskeletal:  Negative for back pain.  Skin:  Negative for rash.  Neurological:  Negative for dizziness.  Hematological:  Does not bruise/bleed easily.   Physical Exam Updated Vital Signs BP 126/65  Pulse 96   Temp 97.9 F (36.6 C) (Oral)   Resp 19   Ht 5\' 1"  (1.549 m)   Wt 90.3 kg   LMP 08/25/2020   SpO2 99%   BMI 37.60 kg/m   Physical Exam Vitals and nursing note reviewed.  Constitutional:      General: She is not in acute distress.    Appearance: She is not ill-appearing.  HENT:     Head: Normocephalic and atraumatic.     Nose: No congestion.  Eyes:     Conjunctiva/sclera: Conjunctivae normal.  Cardiovascular:     Rate and Rhythm: Normal rate and regular rhythm.     Pulses: Normal pulses.     Heart sounds: No murmur heard.   No friction rub. No gallop.  Pulmonary:     Effort: No respiratory distress.     Breath sounds: No wheezing, rhonchi or rales.  Abdominal:     Palpations: Abdomen is soft.      Tenderness: There is no abdominal tenderness. There is right CVA tenderness. There is no left CVA tenderness.     Comments: Abdomen was visualized she has a noted umbilical hernia easily reducible, abdomen nondistended, normal bowel sounds, dull to percussion, nontender to palpation, she has noted right-sided flank pain with positive CVA tenderness.  Musculoskeletal:     Right lower leg: No edema.     Left lower leg: No edema.     Comments: Spine was palpated she had tenderness to palpation along her lumbar spine no step-off deformities present.  Skin:    General: Skin is warm and dry.  Neurological:     Mental Status: She is alert.  Psychiatric:        Mood and Affect: Mood normal.    ED Results / Procedures / Treatments   Labs (all labs ordered are listed, but only abnormal results are displayed) Labs Reviewed  COMPREHENSIVE METABOLIC PANEL - Abnormal; Notable for the following components:      Result Value   Sodium 134 (*)    Potassium 3.2 (*)    Calcium 8.4 (*)    AST 64 (*)    All other components within normal limits  URINALYSIS, ROUTINE W REFLEX MICROSCOPIC - Abnormal; Notable for the following components:   APPearance TURBID (*)    Ketones, ur 5 (*)    Protein, ur 30 (*)    Bacteria, UA MANY (*)    All other components within normal limits  URINE CULTURE  CBC WITH DIFFERENTIAL/PLATELET  PREGNANCY, URINE    EKG None  Radiology CT Renal Stone Study  Result Date: 09/23/2020 CLINICAL DATA:  Flank pain, kidney stone suspected. EXAM: CT ABDOMEN AND PELVIS WITHOUT CONTRAST TECHNIQUE: Multidetector CT imaging of the abdomen and pelvis was performed following the standard protocol without IV contrast. COMPARISON:  CT Jul 05, 2020 FINDINGS: Lower chest: No acute abnormality. Hepatobiliary: Unremarkable noncontrast appearance of the hepatic parenchyma. No evidence gallstone, gallbladder wall thickening or pericholecystic fluid visualized. No biliary ductal dilation Pancreas:  Unremarkable noncontrast appearance of the pancreatic parenchyma. Spleen: Within normal limits. Adrenals/Urinary Tract: Bilateral adrenal glands are unremarkable. No hydronephrosis. Punctate nonobstructive right interpolar renal calculus on image 61/5 no obstructive ureteral or bladder calculi visualized. Urinary bladder is unremarkable for degree of distension. Stomach/Bowel: Stomach is within normal limits. Appendix appears normal. No evidence of bowel wall thickening, distention, or inflammatory changes. Vascular/Lymphatic: No significant vascular findings are present. No pathologically enlarged abdominal or pelvic lymph nodes. Reproductive: Uterus is unremarkable. Dominant  follicle in the left ovary. Simple appearing 3.9 cm right ovarian cyst. No follow-up imaging recommended. Note: This recommendation does not apply to premenarchal patients and to those with increased risk (genetic, family history, elevated tumor markers or other high-risk factors) of ovarian cancer. Reference: JACR 2020 Feb; 17(2):248-254 Other: No abdominopelvic ascites. Moderate fat containing periumbilical hernia with a 17 mm aperture width. 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 Acute osseous abnormality. IMPRESSION: 1. Punctate nonobstructive right nephrolithiasis. No obstructive ureteral or bladder calculi visualized. 2. Moderate fat containing periumbilical hernia. 3. 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 Electronically Signed   By: Maudry Mayhew MD   On: 09/23/2020 22:51    Procedures Procedures   Medications Ordered in ED Medications  sodium chloride 0.9 % bolus 500 mL (0 mLs Intravenous Stopped 09/23/20 2228)  ondansetron (ZOFRAN) injection 4 mg (4 mg Intravenous Given 09/23/20 2156)  morphine 4 MG/ML injection 4 mg (4 mg Intravenous Given 09/23/20 2157)    ED Course  I have reviewed the triage vital signs and the nursing  notes.  Pertinent labs & imaging results that were available during my care of the patient were reviewed by me and considered in my medical decision making (see chart for details).    MDM Rules/Calculators/A&P                          Initial impression-patient presents with right flank pain x2 days.  She is alert, does not appear in acute stress, vital signs reassuring.  Triage obtain basic lab work-up, will add on urine pregnancy and CT renal for further evaluation.  Also provide patient fluids, antiemetics, pain medication and reassess.  Work-up-CBC unremarkable, CMP shows hyponatremia of 134, hypokalemia 3.2, UA shows negative nitrates, leukocytes, shows many bacteria urine pregnancy is negative.  Reassessment-patient was reassessed, states she is feeling much better, vital signs remained stable, patient agreeable for discharge.  Rule out-low suspicion for ectopic pregnancy as urine pregnancy is negative.  Low suspicion for ovarian torsion or ovarian cysts she has no pelvic pain, denies vaginal discharge or vaginal bleeding.  Low suspicion for complicated diverticulitis as she is nontoxic-appearing, no leukocytosis, no peritoneal sign rebound tenderness present my exam.  Low suspicion for appendicitis as presentation is atypical right-sided flank pain x2 days no right lower quadrant tenderness, no peritoneal sign, no leukocytosis.  Low suspicion for SOB as abdomen is nondistended dull imaging is negative for acute findings.  Low suspicion for incarcerated or strangulated hernia that is easily reducible patient still passing gas and having bowel movements.  Low suspicion for kidney stone and Pilo as imaging is negative for both, there is also no nitrates or leukocytes seen on UA.  I have low suspicion for a UTI as she does not really endorse any urinary symptoms but her UA is abnormal she has many bacteria and poor follow-up concerned for possible underlying UTI  will start her on antibiotics and  culture urine.  Plan-   Flank pain since resolved-I suspect this is likely degenerative disc disease in her lumbar spine as noted on CT renal but her UA is abnormal with many bacteria cannot exclude the possibility of a UTI.  Will start her on antibiotics, culture urine, also provided with Flexeril for back pain.  Vital signs have remained stable, no indication for hospital admission.  Patient discussed with attending and they agreed with assessment and plan.  Patient given at home  care as well strict return precautions.  Patient verbalized that they understood agreed to said plan.  Final Clinical Impression(s) / ED Diagnoses Final diagnoses:  Flank pain    Rx / DC Orders ED Discharge Orders          Ordered    cyclobenzaprine (FLEXERIL) 10 MG tablet  2 times daily PRN        09/23/20 2305    cephALEXin (KEFLEX) 500 MG capsule  2 times daily        09/23/20 2305             Carroll SageFaulkner, Bryana Froemming J, PA-C 09/23/20 2308    Bethann BerkshireZammit, Joseph, MD 09/24/20 [phone]

## 2020-09-23 NOTE — ED Notes (Signed)
Pt not located in waiting room  

## 2020-09-23 NOTE — ED Triage Notes (Signed)
Pt c/o right flank pain x 2 days.  HA for past day. Dizziness since last night.  Burns with urination at times.

## 2020-09-23 NOTE — ED Notes (Signed)
Pt was standing outside and brought to [facility]1

## 2020-09-23 NOTE — Discharge Instructions (Signed)
I suspect you have a UTI and chronic back pain.  I have started antibiotics please take as prescribed.  For your back pain I have  given you a prescription for a muscle relaxer this can make you drowsy do not consume alcohol or operate heavy machinery when taking this medication.  Recommend applying heat to the area and stretch out your back.  Given you information for a primary care provider please call to schedule an appointment.  Come back to the emergency department if you develop chest pain, shortness of breath, severe abdominal pain, uncontrolled nausea, vomiting, diarrhea.

## 2020-09-26 LAB — URINE CULTURE: Culture: 60000 — AB

## 2020-09-27 ENCOUNTER — Telehealth: Payer: Self-pay

## 2020-09-27 NOTE — Telephone Encounter (Signed)
Post ED Visit - Positive Culture Follow-up  Culture report reviewed by antimicrobial stewardship pharmacist: [facility] [x]  , Pharm.D. []  Audie Clear, Pharm.D., BCPS AQ-ID []  , Pharm.D., BCPS []  Celedonio Miyamoto, .D., BCPS []  [place], .D., BCPS, AAHIVP []  Georgina Pillion, Pharm.D., BCPS, AAHIVP []  [address], PharmD, BCPS []  , PharmD, BCPS []  [place], PharmD, BCPS []  [address], PharmD []  , PharmD, BCPS []  Estella Husk, PharmD  Pharmacy Team []  Lysle Pearl, PharmD []  , PharmD []  Phillips Climes, PharmD []  , Rph []  Agapito Games) , PharmD []  Verlan Friends, PharmD []  , PharmD []  Mervyn Gay, PharmD []  , PharmD []  Vinnie Level, PharmD []  [facility], PharmD []  , PharmD []  Len Childs, PharmD   Positive urine culture Treated with Cephalexin, organism sensitive to the same and no further patient follow-up is required at this time.  09/27/2020, 10:41 AM

## 2021-01-12 ENCOUNTER — Emergency Department (HOSPITAL_COMMUNITY)
Admission: EM | Admit: 2021-01-12 | Discharge: 2021-01-12 | Disposition: A | Discharge status: Home / Self Care | Payer: Medicaid Other | Attending: Emergency Medicine | Admitting: Emergency Medicine

## 2021-01-12 ENCOUNTER — Encounter (HOSPITAL_COMMUNITY): Payer: Self-pay

## 2021-01-12 ENCOUNTER — Other Ambulatory Visit: Payer: Self-pay

## 2021-01-12 DIAGNOSIS — Z5321 Procedure and treatment not carried out due to patient leaving prior to being seen by health care provider: Secondary | ICD-10-CM | POA: Insufficient documentation

## 2021-01-12 DIAGNOSIS — F1910 Other psychoactive substance abuse, uncomplicated: Secondary | ICD-10-CM | POA: Insufficient documentation

## 2021-01-12 DIAGNOSIS — H1033 Unspecified acute conjunctivitis, bilateral: Secondary | ICD-10-CM | POA: Diagnosis not present

## 2021-01-12 DIAGNOSIS — H5789 Other specified disorders of eye and adnexa: Secondary | ICD-10-CM | POA: Diagnosis present

## 2021-01-12 NOTE — ED Triage Notes (Signed)
Pt crying in triage, states she has lost 34 lbs in the last month. Pt states this morning she had tape worms coming out of her mouth and nose.

## 2021-01-12 NOTE — ED Notes (Signed)
Per staff report patient asked which direction was out and patient proceeded to parking lot and states that she will be back. This nurse and CN to parking lot to look for patient and she cannot be located.

## 2021-01-12 NOTE — ED Provider Notes (Signed)
[facility] EMERGENCY DEPARTMENT Provider Note   CSN: [id number] Arrival date & time: 01/12/21  8338     History Chief Complaint  Patient presents with   Weight Loss    Barbara Thompson is a 40 y.o. female with a history of meth and cocaine abuse, stating that she last used meth and cocaine yesterday presenting for evaluation of bilateral eye redness, purulent drainage which she confirms by pictures on her cell phone.  She states the symptoms started yesterday.  She also endorses having itchy skin predominantly on her face and has noticed bumps inside her mouth that she is concerned may be a worm infestation.  She has been searching [employer] and thinks she has an Ascarid infection.  She has not seen any worms however.  She endorses she is under the care of of Dr. Cathey Endow here in [place] for her substance abuse and does have a prescription for Suboxone.  She denies fevers or chills, headache, chest pain, shortness of breath.  The history is provided by the patient.      Past Medical History:  Diagnosis Date   Gestational diabetes    Preeclampsia     There are no problems to display for this patient.   Past Surgical History:  Procedure Laterality Date   CESAREAN SECTION       OB History     Gravida  1   Para      Term      Preterm      AB      Living         SAB      IAB      Ectopic      Multiple      Live Births              No family history on file.  Social History   Tobacco Use   Smoking status: Never   Smokeless tobacco: Never  Vaping Use   Vaping Use: Never used  Substance Use Topics   Alcohol use: No    Comment: occ   Drug use: Yes    Types: IV, Methamphetamines    Comment: meth used 2 days ago.    Home Medications Prior to Admission medications   Medication Sig Start Date End Date Taking? Authorizing Provider  SUBOXONE 8-2 MG FILM Take 0.5 strips by mouth 4 (four) times daily. 01/09/21  Yes [provider]   cyclobenzaprine (FLEXERIL) 10 MG tablet Take 1 tablet (10 mg total) by mouth 2 (two) times daily as needed for muscle spasms. Patient not taking: Reported on 01/12/2021 09/23/20   Carroll Sage, PA-C  diphenhydrAMINE-PSE-APAP (BENADRYL ALLERGY/COLD PO) Take 2 tablets by mouth every 6 (six) hours as needed (allergies). Patient not taking: Reported on 01/12/2021    [provider]    Allergies    Patient has no known allergies.  Review of Systems   Review of Systems  Constitutional:  Negative for chills and fever.  HENT:  Negative for congestion and sore throat.   Eyes:  Positive for discharge and redness.  Respiratory:  Negative for chest tightness and shortness of breath.   Cardiovascular:  Negative for chest pain.  Gastrointestinal:  Negative for abdominal pain, nausea and vomiting.  Genitourinary: Negative.   Musculoskeletal:  Negative for arthralgias, joint swelling and neck pain.  Skin: Negative.  Negative for rash and wound.  Neurological:  Negative for dizziness, weakness, light-headedness, numbness and headaches.  Psychiatric/Behavioral:  Negative for  suicidal ideas. The patient is nervous/anxious.   All other systems reviewed and are negative.  Physical Exam Updated Vital Signs BP 132/60   Pulse (!) 111   Temp 98.4 F (36.9 C) (Oral)   Resp 20   Wt 79.8 kg   LMP 12/19/2020   SpO2 97%   BMI 33.25 kg/m   Physical Exam Vitals and nursing note reviewed.  Constitutional:      Appearance: She is well-developed.  HENT:     Head: Normocephalic and atraumatic.     Mouth/Throat:     Mouth: Mucous membranes are moist.     Pharynx: Oropharynx is clear.  Eyes:     Conjunctiva/sclera:     Right eye: Right conjunctiva is injected. Exudate present.     Left eye: Left conjunctiva is injected. Exudate present.     Comments: Trace dried exudate along lower lashes  Cardiovascular:     Rate and Rhythm: Normal rate and regular rhythm.     Heart sounds: Normal  heart sounds.  Pulmonary:     Effort: Pulmonary effort is normal.     Breath sounds: Normal breath sounds. No wheezing.  Abdominal:     General: Bowel sounds are normal.     Palpations: Abdomen is soft.     Tenderness: There is no abdominal tenderness.  Musculoskeletal:        General: Normal range of motion.     Cervical back: Normal range of motion.  Skin:    General: Skin is warm and dry.  Neurological:     General: No focal deficit present.     Mental Status: She is alert and oriented to person, place, and time.  Psychiatric:        Mood and Affect: Mood is anxious.        Behavior: Behavior is cooperative.        Thought Content: Thought content is paranoid. Thought content does not include homicidal or suicidal plan.    ED Results / Procedures / Treatments   Labs (all labs ordered are listed, but only abnormal results are displayed) Labs Reviewed - No data to display  EKG None  Radiology No results found.  Procedures Procedures   Medications Ordered in ED Medications - No data to display  ED Course  I have reviewed the triage vital signs and the nursing notes.  Pertinent labs & imaging results that were available during my care of the patient were reviewed by me and considered in my medical decision making (see chart for details).    MDM Rules/Calculators/A&P                           Patient with subjective symptoms of acute conjunctivitis, she was placed on antibiotic ophthalmic drops for this condition.  She was given reassurance that her other symptoms do not in any way suggest a worm infestation but side effects of using meth and cocaine. She was strongly encouraged to stop using these drugs and to rely on her Suboxone and close f/u with Dr. Cathey Endow.  Patient has no SI or HI.  Her sister will be taking patient home.  11:30 AM Advised by RN that pt left the dept prior to receiving her ophthalmic drops. Final Clinical Impression(s) / ED Diagnoses Final  diagnoses:  Acute conjunctivitis of both eyes, unspecified acute conjunctivitis type  Substance abuse (HCC)    Rx / DC Orders ED Discharge Orders     None  Burgess Amor, PA-C 01/12/21 1130    Mancel Bale, MD 01/13/21 [phone]

## 2021-01-17 ENCOUNTER — Other Ambulatory Visit: Payer: Self-pay

## 2021-01-17 ENCOUNTER — Encounter (HOSPITAL_COMMUNITY): Payer: Self-pay | Admitting: *Deleted

## 2021-01-17 ENCOUNTER — Observation Stay (HOSPITAL_COMMUNITY)
Admission: EM | Admit: 2021-01-17 | Discharge: 2021-01-18 | Disposition: A | Discharge status: Home / Self Care | Payer: Medicaid Other | Attending: Internal Medicine | Admitting: Internal Medicine

## 2021-01-17 DIAGNOSIS — Z20822 Contact with and (suspected) exposure to covid-19: Secondary | ICD-10-CM | POA: Insufficient documentation

## 2021-01-17 DIAGNOSIS — L02211 Cutaneous abscess of abdominal wall: Secondary | ICD-10-CM | POA: Diagnosis not present

## 2021-01-17 DIAGNOSIS — L03311 Cellulitis of abdominal wall: Secondary | ICD-10-CM

## 2021-01-17 LAB — BASIC METABOLIC PANEL
Anion gap: 8 (ref 5–15)
BUN: 6 mg/dL (ref 6–20)
CO2: 28 mmol/L (ref 22–32)
Calcium: 8.4 mg/dL — ABNORMAL LOW (ref 8.9–10.3)
Chloride: 101 mmol/L (ref 98–111)
Creatinine, Ser: 0.45 mg/dL (ref 0.44–1.00)
GFR, Estimated: >60 mL/min (ref >60)
Glucose, Bld: 106 mg/dL — ABNORMAL HIGH (ref 70–99)
Potassium: 3 mmol/L — ABNORMAL LOW (ref 3.5–5.1)
Sodium: 137 mmol/L (ref 135–145)

## 2021-01-17 LAB — CBC WITH DIFFERENTIAL/PLATELET
Abs Immature Granulocytes: 0.02 10*3/uL (ref 0.00–0.07)
Basophils Absolute: 0 10*3/uL (ref 0.0–0.1)
Basophils Relative: 0 %
Eosinophils Absolute: 0.1 10*3/uL (ref 0.0–0.5)
Eosinophils Relative: 1 %
HCT: 35.4 % — ABNORMAL LOW (ref 36.0–46.0)
Hemoglobin: 11.6 g/dL — ABNORMAL LOW (ref 12.0–15.0)
Immature Granulocytes: 0 %
Lymphocytes Relative: 31 %
Lymphs Abs: 2.5 10*3/uL (ref 0.7–4.0)
MCH: 26.9 pg (ref 26.0–34.0)
MCHC: 32.8 g/dL (ref 30.0–36.0)
MCV: 82.1 fL (ref 80.0–100.0)
Monocytes Absolute: 0.5 10*3/uL (ref 0.1–1.0)
Monocytes Relative: 6 %
Neutro Abs: 4.9 10*3/uL (ref 1.7–7.7)
Neutrophils Relative %: 62 %
Platelets: 200 10*3/uL (ref 150–400)
RBC: 4.31 MIL/uL (ref 3.87–5.11)
RDW: 13.5 % (ref 11.5–15.5)
WBC: 8 10*3/uL (ref 4.0–10.5)
nRBC: 0 % (ref 0.0–0.2)

## 2021-01-17 LAB — HCG, SERUM, QUALITATIVE: Preg, Serum: NEGATIVE

## 2021-01-17 LAB — LACTIC ACID, PLASMA: Lactic Acid, Venous: 0.9 mmol/L (ref 0.5–1.9)

## 2021-01-17 MED ORDER — LIDOCAINE-EPINEPHRINE (PF) 2 %-1:200000 IJ SOLN
20.0000 mL | Freq: Once | INTRAMUSCULAR | Status: DC
  Filled 2021-01-17: qty 20

## 2021-01-17 MED ORDER — HYDROMORPHONE HCL 1 MG/ML IJ SOLN
1.0000 mg | Freq: Once | INTRAMUSCULAR | Status: AC
Start: 2021-01-17 — End: 2021-01-17
  Administered 2021-01-17: 1 mg via INTRAVENOUS
  Filled 2021-01-17: qty 1

## 2021-01-17 MED ORDER — VANCOMYCIN HCL 1500 MG/300ML IV SOLN
1500.0000 mg | Freq: Once | INTRAVENOUS | Status: DC
  Filled 2021-01-17: qty 300

## 2021-01-17 NOTE — ED Triage Notes (Signed)
Pt c/o abscess to left lower abdomen x one week; area is black with drainage and foul odor

## 2021-01-17 NOTE — ED Notes (Signed)
Anaerobic culture, lidocaine and betadine at bedside; pt given gown and told to take everything off on bottom and put gown on; pt verbalized understanding

## 2021-01-17 NOTE — ED Provider Notes (Signed)
[facility] EMERGENCY DEPARTMENT Provider Note   CSN: [id number] Arrival date & time: 01/17/21  2033     History Chief Complaint  Patient presents with   Abscess    Barbara Thompson is a 40 y.o. female.  She is here with a complaint of a boil to her lower abdomen that is been there about 1 week.  Getting progressively worse.  She denies any fevers chills nausea vomiting.  She said she has gotten boils before.  She tried to pop it with a needle and got some pus out.  He does admit to injection drug use into the skin but states she has not done it for 2 or 3 weeks.  The history is provided by the patient.  Abscess Location:  Torso Torso abscess location:  Abd LLQ Size:  10 Abscess quality: fluctuance, induration, painful, redness and warmth   Duration:  1 week Progression:  Worsening Pain details:    Quality:  Throbbing   Severity:  Severe   Timing:  Constant   Progression:  Worsening Chronicity:  Recurrent Relieved by:  Nothing Worsened by:  Nothing Ineffective treatments:  Warm compresses Associated symptoms: no fever, no headaches, no nausea and no vomiting       Past Medical History:  Diagnosis Date   Gestational diabetes    Preeclampsia     There are no problems to display for this patient.   Past Surgical History:  Procedure Laterality Date   CESAREAN SECTION       OB History     Gravida  1   Para      Term      Preterm      AB      Living         SAB      IAB      Ectopic      Multiple      Live Births              History reviewed. No pertinent family history.  Social History   Tobacco Use   Smoking status: Never   Smokeless tobacco: Never  Vaping Use   Vaping Use: Never used  Substance Use Topics   Alcohol use: No    Comment: occ   Drug use: Yes    Types: IV, Methamphetamines    Comment: meth used 2 days ago.    Home Medications Prior to Admission medications   Medication Sig Start Date End Date Taking?  Authorizing Provider  cyclobenzaprine (FLEXERIL) 10 MG tablet Take 1 tablet (10 mg total) by mouth 2 (two) times daily as needed for muscle spasms. Patient not taking: Reported on 01/12/2021 09/23/20   Carroll Sage, PA-C  diphenhydrAMINE-PSE-APAP (BENADRYL ALLERGY/COLD PO) Take 2 tablets by mouth every 6 (six) hours as needed (allergies). Patient not taking: Reported on 01/12/2021    [provider]  SUBOXONE 8-2 MG FILM Take 0.5 strips by mouth 4 (four) times daily. 01/09/21   [provider]    Allergies    Patient has no known allergies.  Review of Systems   Review of Systems  Constitutional:  Negative for fever.  HENT:  Negative for sore throat.   Eyes:  Negative for visual disturbance.  Respiratory:  Negative for shortness of breath.   Cardiovascular:  Negative for chest pain.  Gastrointestinal:  Negative for abdominal pain, nausea and vomiting.  Genitourinary:  Negative for dysuria.  Musculoskeletal:  Negative for gait problem.  Skin:  Positive  for wound.  Neurological:  Negative for headaches.   Physical Exam Updated Vital Signs BP 137/71 (BP Location: Right Arm)   Pulse 85   Temp 98 F (36.7 C) (Oral)   Resp 16   Ht 5\' 1"  (1.549 m)   Wt 79.8 kg   LMP 12/19/2020   SpO2 97%   BMI 33.25 kg/m   Physical Exam Vitals and nursing note reviewed.  Constitutional:      General: She is not in acute distress.    Appearance: Normal appearance. She is well-developed.  HENT:     Head: Normocephalic and atraumatic.  Eyes:     Conjunctiva/sclera: Conjunctivae normal.  Cardiovascular:     Rate and Rhythm: Normal rate and regular rhythm.     Heart sounds: No murmur heard. Pulmonary:     Effort: Pulmonary effort is normal. No respiratory distress.     Breath sounds: Normal breath sounds.  Abdominal:     Palpations: Abdomen is soft. There is mass.     Tenderness: There is abdominal tenderness.     Comments: She is a large area of erythema over lower  abdominal wall.  There is an area about 10 cm that is indurated and there is a necrotic center with some fluctuance.  Musculoskeletal:        General: No swelling, deformity or signs of injury. Normal range of motion.     Cervical back: Neck supple.  Skin:    General: Skin is warm and dry.     Capillary Refill: Capillary refill takes less than 2 seconds.  Neurological:     General: No focal deficit present.     Mental Status: She is alert.  Psychiatric:        Mood and Affect: Mood normal.     ED Results / Procedures / Treatments   Labs (all labs ordered are listed, but only abnormal results are displayed) Labs Reviewed  CBC WITH DIFFERENTIAL/PLATELET - Abnormal; Notable for the following components:      Result Value   Hemoglobin 11.6 (*)    HCT 35.4 (*)    All other components within normal limits  BASIC METABOLIC PANEL - Abnormal; Notable for the following components:   Potassium 3.0 (*)    Glucose, Bld 106 (*)    Calcium 8.4 (*)    All other components within normal limits  CULTURE, BLOOD (ROUTINE X 2)  CULTURE, BLOOD (ROUTINE X 2)  RESP PANEL BY RT-PCR (FLU A&B, COVID) ARPGX2  AEROBIC CULTURE W GRAM STAIN (SUPERFICIAL SPECIMEN)  LACTIC ACID, PLASMA  HCG, SERUM, QUALITATIVE    EKG None  Radiology No results found.  Procedures .Marland KitchenIncision and Drainage  Date/Time: 01/17/2021 11:31 PM Performed by: Hayden Rasmussen, MD Authorized by: Hayden Rasmussen, MD   Consent:    Consent obtained:  Verbal   Consent given by:  Patient   Risks discussed:  Bleeding, incomplete drainage, pain and infection   Alternatives discussed:  No treatment, delayed treatment and referral Universal protocol:    Procedure explained and questions answered to patient or proxy's satisfaction: yes     Patient identity confirmed:  Verbally with patient Location:    Type:  Abscess   Size:  5   Location:  Trunk   Trunk location:  Abdomen Pre-procedure details:    Skin preparation:   Povidone-iodine Anesthesia:    Anesthesia method:  Local infiltration   Local anesthetic:  Lidocaine 2% WITH epi Procedure type:    Complexity:  Simple  Procedure details:    Incision types:  Elliptical   Drainage:  Purulent   Drainage amount:  Scant   Wound treatment:  Wound left open Post-procedure details:    Procedure completion:  Tolerated well, no immediate complications   Medications Ordered in ED Medications  HYDROmorphone (DILAUDID) injection 1 mg (has no administration in time range)    ED Course  I have reviewed the triage vital signs and the nursing notes.  Pertinent labs & imaging results that were available during my care of the patient were reviewed by me and considered in my medical decision making (see chart for details).    MDM Rules/Calculators/A&P                          This patient complains of abdominal pain and abscess; this involves an extensive number of treatment Options and is a complaint that carries with it a high risk of complications and Morbidity. The differential includes abscess, necrotizing fasciitis, sepsis, Sirs, cellulitis  I ordered, reviewed and interpreted labs, which included CBC with normal white count, hemoglobin lower than priors, chemistries with low potassium, mildly elevated glucose, lactate not elevated, blood culture sent wound culture sent, pregnancy test negative I ordered medication IV Dilaudid for patient's pain, IV antibiotics  Previous records obtained and reviewed in epic no recent admissions I consulted Dr. Arnoldo Morale general surgery And discussed lab and imaging findings  Critical Interventions: None  After the interventions stated above, I reevaluated the patient and found patient to be hemodynamically stable.  She is agreeable to admission for IV antibiotics.  Patient's care signed out to oncoming provider Dr. Wyvonnia Dusky to review with hospitalist regarding admission.   Final Clinical Impression(s) / ED Diagnoses Final  diagnoses:  Abdominal wall abscess  Cellulitis of abdominal wall    Rx / DC Orders ED Discharge Orders     None        Hayden Rasmussen, MD 01/18/21 1050

## 2021-01-18 ENCOUNTER — Other Ambulatory Visit: Payer: Self-pay

## 2021-01-18 ENCOUNTER — Encounter (HOSPITAL_COMMUNITY): Payer: Self-pay

## 2021-01-18 ENCOUNTER — Inpatient Hospital Stay (HOSPITAL_COMMUNITY)
Admission: EM | Admit: 2021-01-18 | Discharge: 2021-01-21 | DRG: 603 | Disposition: A | Discharge status: Home / Self Care | Payer: Medicaid Other | Attending: Internal Medicine | Admitting: Internal Medicine

## 2021-01-18 DIAGNOSIS — F1590 Other stimulant use, unspecified, uncomplicated: Secondary | ICD-10-CM | POA: Diagnosis present

## 2021-01-18 DIAGNOSIS — L02211 Cutaneous abscess of abdominal wall: Secondary | ICD-10-CM | POA: Diagnosis present

## 2021-01-18 DIAGNOSIS — K429 Umbilical hernia without obstruction or gangrene: Secondary | ICD-10-CM | POA: Diagnosis present

## 2021-01-18 DIAGNOSIS — Z79899 Other long term (current) drug therapy: Secondary | ICD-10-CM

## 2021-01-18 DIAGNOSIS — L0390 Cellulitis, unspecified: Secondary | ICD-10-CM

## 2021-01-18 DIAGNOSIS — F1120 Opioid dependence, uncomplicated: Secondary | ICD-10-CM | POA: Diagnosis present

## 2021-01-18 DIAGNOSIS — E876 Hypokalemia: Secondary | ICD-10-CM | POA: Diagnosis present

## 2021-01-18 DIAGNOSIS — N6310 Unspecified lump in the right breast, unspecified quadrant: Secondary | ICD-10-CM | POA: Diagnosis present

## 2021-01-18 DIAGNOSIS — L03311 Cellulitis of abdominal wall: Principal | ICD-10-CM | POA: Diagnosis present

## 2021-01-18 DIAGNOSIS — Z7151 Drug abuse counseling and surveillance of drug abuser: Secondary | ICD-10-CM

## 2021-01-18 DIAGNOSIS — Z23 Encounter for immunization: Secondary | ICD-10-CM

## 2021-01-18 DIAGNOSIS — Z20822 Contact with and (suspected) exposure to covid-19: Secondary | ICD-10-CM | POA: Diagnosis present

## 2021-01-18 LAB — RESP PANEL BY RT-PCR (FLU A&B, COVID) ARPGX2
Influenza A by PCR: NEGATIVE
Influenza B by PCR: NEGATIVE
SARS Coronavirus 2 by RT PCR: NEGATIVE

## 2021-01-18 MED ORDER — VANCOMYCIN HCL 1250 MG/250ML IV SOLN: 1250.0000 mg | INTRAVENOUS | Status: DC

## 2021-01-18 NOTE — ED Triage Notes (Signed)
Pt c/o abscess to left lower abdomen x one week; area is black with drainage and foul odor. Pt was seen here last night and admission was recommended; however, pt left AMA

## 2021-01-18 NOTE — ED Notes (Signed)
Pt requesting this RN call her friends to inform them of her admission; friends called and given direct number into room to speak with pt;   Pt wound packed with saline soaked gauze and covered with abd pads and mepiplex tape

## 2021-01-18 NOTE — ED Notes (Signed)
Pt keep coming out at the desk stating she needs to use phone to get in touch with someone regarding her son; pt given phone and instructed on how to use;  Pt comes out to he desk stating she needs someone to escort her to her car so she can get her speaker and her food; pt then informed she is NPO and cannot have food; pt becomes very irate stating she needs to leave to take care of her son;   Pt states very angrily "what would you do if you have children?"  This RN informed pt this is a very serious matter and encouraged to stay; pt states "take my IV out"  IV removed and pt seen getting clothed and walk out of the department with a steady gait

## 2021-01-18 NOTE — Progress Notes (Signed)
Pharmacy Antibiotic Note  Barbara Thompson is a 40 y.o. female admitted on 01/17/2021 with cellulitis.  Pharmacy has been consulted for Vancomycin dosing. WBC WNL. Renal function good.   Plan: Vancomycin 1500 mg IV x 1, then 1250 mg IV q24h >>>Estimated AUC: 506 Trend WBC, temp, renal function  F/U infectious work-up Drug levels as indicated   Height: 5\' 1"  (154.9 cm) Weight: 79.8 kg (176 lb) IBW/kg (Calculated) : 47.8  Temp (24hrs), Avg:98 F (36.7 C), Min:98 F (36.7 C), Max:98 F (36.7 C)  Recent Labs  Lab 01/17/21 2249  WBC 8.0  CREATININE 0.45  LATICACIDVEN 0.9    Estimated Creatinine Clearance: 87.6 mL/min (by C-G formula based on SCr of 0.45 mg/dL).    No Known Allergies  01/19/21, PharmD, BCPS Clinical Pharmacist Phone: [phone]

## 2021-01-18 NOTE — H&P (Incomplete)
History and Physical  Barbara Thompson PIR:[id number] DOB: [birth year] DOA: 01/17/2021  Referring physician: ***  PCP: Cleophus Molt, MD  Outpatient Specialists: *** Patient coming from: *** & is able to ambulate ***  Chief Complaint: ***   HPI: Barbara Thompson is a 40 y.o. female with medical history significant for *** (For level 3, the HPI must include 4+ descriptors: Location, Quality, Severity, Duration, Timing, Context, modifying factors, associated signs/symptoms and/or status of 3+ chronic problems.)   ED Course: ***  Review of Systems: Review of systems as noted in the HPI. All other systems reviewed and are negative.   Past Medical History:  Diagnosis Date   Gestational diabetes    Preeclampsia    Past Surgical History:  Procedure Laterality Date   CESAREAN SECTION      Social History:  reports that she has never smoked. She has never used smokeless tobacco. She reports current drug use. Drugs: IV and Methamphetamines. She reports that she does not drink alcohol.   No Known Allergies  History reviewed. No pertinent family history.  ***  Prior to Admission medications   Medication Sig Start Date End Date Taking? Authorizing Provider  cyclobenzaprine (FLEXERIL) 10 MG tablet Take 1 tablet (10 mg total) by mouth 2 (two) times daily as needed for muscle spasms. Patient not taking: Reported on 01/12/2021 09/23/20   Carroll Sage, PA-C  diphenhydrAMINE-PSE-APAP (BENADRYL ALLERGY/COLD PO) Take 2 tablets by mouth every 6 (six) hours as needed (allergies). Patient not taking: Reported on 01/12/2021    [provider]  SUBOXONE 8-2 MG FILM Take 0.5 strips by mouth 4 (four) times daily. 01/09/21   [provider]    Physical Exam: BP 137/71 (BP Location: Right Arm)    Pulse 85    Temp 98 F (36.7 C) (Oral)    Resp 16    Ht 5\' 1"  (1.549 m)    Wt 79.8 kg    LMP 12/19/2020    SpO2 97%    BMI 33.25 kg/m   General: 40 y.o. year-old female well developed  well nourished in no acute distress.  Alert and oriented x3. HEENT: NCAT, EOMI Neck: Supple, trachea medial Cardiovascular: Regular rate and rhythm with no rubs or gallops.  No thyromegaly or JVD noted.  No lower extremity edema. 2/4 pulses in all 4 extremities. Respiratory: Clear to auscultation with no wheezes or rales. Good inspiratory effort. Abdomen: Soft, nontender nondistended with normal bowel sounds x4 quadrants. Muskuloskeletal: No cyanosis, clubbing or edema noted bilaterally Neuro: CN II-XII intact, strength 5/5 x 4, sensation, reflexes intact Skin: No ulcerative lesions noted or rashes Psychiatry: Judgement and insight appear normal. Mood is appropriate for condition and setting          Labs on Admission:  Basic Metabolic Panel: Recent Labs  Lab 01/17/21 2249  NA 137  K 3.0*  CL 101  CO2 28  GLUCOSE 106*  BUN 6  CREATININE 0.45  CALCIUM 8.4*   Liver Function Tests: No results for input(s): AST, ALT, ALKPHOS, BILITOT, PROT, ALBUMIN in the last 168 hours. No results for input(s): LIPASE, AMYLASE in the last 168 hours. No results for input(s): AMMONIA in the last 168 hours. CBC: Recent Labs  Lab 01/17/21 2249  WBC 8.0  NEUTROABS 4.9  HGB 11.6*  HCT 35.4*  MCV 82.1  PLT 200   Cardiac Enzymes: No results for input(s): CKTOTAL, CKMB, CKMBINDEX, TROPONINI in the last 168 hours.  BNP (last 3 results) No results for input(s): BNP  in the last 8760 hours.  ProBNP (last 3 results) No results for input(s): PROBNP in the last 8760 hours.  CBG: No results for input(s): GLUCAP in the last 168 hours.  Radiological Exams on Admission: No results found.  EKG: I independently viewed the EKG done and my findings are as followed: ***   Assessment/Plan Present on Admission:  Abdominal wall abscess  Principal Problem:   Abdominal wall abscess   DVT prophylaxis: ***   Code Status: ***   Family Communication: ***   Disposition Plan: ***   Consults  called: ***   Admission status: ***     Frankey Shown MD [facility] Pager [phone]  If 7PM-7AM, please contact night-coverage [email or web address] Password [email or web address]  01/18/2021, 12:00 AM

## 2021-01-19 ENCOUNTER — Inpatient Hospital Stay (HOSPITAL_COMMUNITY): Payer: Medicaid Other

## 2021-01-19 DIAGNOSIS — K429 Umbilical hernia without obstruction or gangrene: Secondary | ICD-10-CM | POA: Diagnosis present

## 2021-01-19 DIAGNOSIS — Z7151 Drug abuse counseling and surveillance of drug abuser: Secondary | ICD-10-CM | POA: Diagnosis not present

## 2021-01-19 DIAGNOSIS — F1120 Opioid dependence, uncomplicated: Secondary | ICD-10-CM | POA: Diagnosis present

## 2021-01-19 DIAGNOSIS — L0390 Cellulitis, unspecified: Secondary | ICD-10-CM

## 2021-01-19 DIAGNOSIS — Z20822 Contact with and (suspected) exposure to covid-19: Secondary | ICD-10-CM | POA: Diagnosis present

## 2021-01-19 DIAGNOSIS — L02211 Cutaneous abscess of abdominal wall: Secondary | ICD-10-CM | POA: Diagnosis present

## 2021-01-19 DIAGNOSIS — F1590 Other stimulant use, unspecified, uncomplicated: Secondary | ICD-10-CM | POA: Diagnosis present

## 2021-01-19 DIAGNOSIS — E876 Hypokalemia: Secondary | ICD-10-CM | POA: Diagnosis present

## 2021-01-19 DIAGNOSIS — Z23 Encounter for immunization: Secondary | ICD-10-CM | POA: Diagnosis not present

## 2021-01-19 DIAGNOSIS — L03311 Cellulitis of abdominal wall: Principal | ICD-10-CM

## 2021-01-19 DIAGNOSIS — N6310 Unspecified lump in the right breast, unspecified quadrant: Secondary | ICD-10-CM | POA: Diagnosis present

## 2021-01-19 DIAGNOSIS — Z79899 Other long term (current) drug therapy: Secondary | ICD-10-CM | POA: Diagnosis not present

## 2021-01-19 LAB — CBC WITH DIFFERENTIAL/PLATELET
Abs Immature Granulocytes: 0.02 10*3/uL (ref 0.00–0.07)
Basophils Absolute: 0 10*3/uL (ref 0.0–0.1)
Basophils Relative: 0 %
Eosinophils Absolute: 0.1 10*3/uL (ref 0.0–0.5)
Eosinophils Relative: 1 %
HCT: 34.8 % — ABNORMAL LOW (ref 36.0–46.0)
Hemoglobin: 11.5 g/dL — ABNORMAL LOW (ref 12.0–15.0)
Immature Granulocytes: 0 %
Lymphocytes Relative: 29 %
Lymphs Abs: 2.3 10*3/uL (ref 0.7–4.0)
MCH: 27.2 pg (ref 26.0–34.0)
MCHC: 33 g/dL (ref 30.0–36.0)
MCV: 82.3 fL (ref 80.0–100.0)
Monocytes Absolute: 0.4 10*3/uL (ref 0.1–1.0)
Monocytes Relative: 5 %
Neutro Abs: 5.2 10*3/uL (ref 1.7–7.7)
Neutrophils Relative %: 65 %
Platelets: 227 10*3/uL (ref 150–400)
RBC: 4.23 MIL/uL (ref 3.87–5.11)
RDW: 13.6 % (ref 11.5–15.5)
WBC: 8 10*3/uL (ref 4.0–10.5)
nRBC: 0 % (ref 0.0–0.2)

## 2021-01-19 LAB — COMPREHENSIVE METABOLIC PANEL
ALT: 13 U/L (ref 0–44)
AST: 11 U/L — ABNORMAL LOW (ref 15–41)
Albumin: 3.1 g/dL — ABNORMAL LOW (ref 3.5–5.0)
Alkaline Phosphatase: 54 U/L (ref 38–126)
Anion gap: 9 (ref 5–15)
BUN: 6 mg/dL (ref 6–20)
CO2: 28 mmol/L (ref 22–32)
Calcium: 8.5 mg/dL — ABNORMAL LOW (ref 8.9–10.3)
Chloride: 101 mmol/L (ref 98–111)
Creatinine, Ser: 0.46 mg/dL (ref 0.44–1.00)
GFR, Estimated: >60 mL/min (ref >60)
Glucose, Bld: 97 mg/dL (ref 70–99)
Potassium: 3.8 mmol/L (ref 3.5–5.1)
Sodium: 138 mmol/L (ref 135–145)
Total Bilirubin: 0.1 mg/dL — ABNORMAL LOW (ref 0.3–1.2)
Total Protein: 6.4 g/dL — ABNORMAL LOW (ref 6.5–8.1)

## 2021-01-19 LAB — HIV ANTIBODY (ROUTINE TESTING W REFLEX): HIV Screen 4th Generation wRfx: NONREACTIVE

## 2021-01-19 LAB — RESP PANEL BY RT-PCR (FLU A&B, COVID) ARPGX2
Influenza A by PCR: NEGATIVE
Influenza B by PCR: NEGATIVE
SARS Coronavirus 2 by RT PCR: NEGATIVE

## 2021-01-19 LAB — MAGNESIUM: Magnesium: 2 mg/dL (ref 1.7–2.4)

## 2021-01-19 LAB — LACTIC ACID, PLASMA
Lactic Acid, Venous: 0.9 mmol/L (ref 0.5–1.9)
Lactic Acid, Venous: 0.7 mmol/L (ref 0.5–1.9)

## 2021-01-19 LAB — PHOSPHORUS: Phosphorus: 3.2 mg/dL (ref 2.5–4.6)

## 2021-01-19 MED ORDER — COLLAGENASE 250 UNIT/GM EX OINT
TOPICAL_OINTMENT | Freq: Two times a day (BID) | CUTANEOUS | Status: DC
Start: 2021-01-19 — End: 2021-01-20
  Filled 2021-01-19: qty 30

## 2021-01-19 MED ORDER — MORPHINE SULFATE (PF) 4 MG/ML IV SOLN
4.0000 mg | Freq: Once | INTRAVENOUS | Status: AC
  Administered 2021-01-19: 4 mg via INTRAVENOUS
  Filled 2021-01-19: qty 1

## 2021-01-19 MED ORDER — ONDANSETRON HCL 4 MG/2ML IJ SOLN
4.0000 mg | Freq: Once | INTRAMUSCULAR | Status: AC
  Administered 2021-01-19: 4 mg via INTRAVENOUS
  Filled 2021-01-19: qty 2

## 2021-01-19 MED ORDER — SODIUM CHLORIDE 0.9 % IV BOLUS
500.0000 mL | Freq: Once | INTRAVENOUS | Status: AC
  Administered 2021-01-19: 500 mL via INTRAVENOUS

## 2021-01-19 MED ORDER — INFLUENZA VAC SPLIT QUAD 0.5 ML IM SUSY: 0.5000 mL | PREFILLED_SYRINGE | INTRAMUSCULAR | Status: DC

## 2021-01-19 MED ORDER — HYDROMORPHONE HCL 1 MG/ML IJ SOLN
0.5000 mg | INTRAMUSCULAR | Status: DC | PRN
  Administered 2021-01-19 – 2021-01-21 (×10): 0.5 mg via INTRAVENOUS
  Filled 2021-01-19 (×12): qty 0.5

## 2021-01-19 MED ORDER — VANCOMYCIN HCL 1500 MG/300ML IV SOLN
1500.0000 mg | Freq: Once | INTRAVENOUS | Status: AC
  Administered 2021-01-19: 1500 mg via INTRAVENOUS
  Filled 2021-01-19: qty 300

## 2021-01-19 MED ORDER — IOHEXOL 300 MG/ML  SOLN
100.0000 mL | Freq: Once | INTRAMUSCULAR | Status: AC | PRN
  Administered 2021-01-19: 100 mL via INTRAVENOUS

## 2021-01-19 MED ORDER — SODIUM CHLORIDE 0.9 % IV SOLN
2.0000 g | Freq: Three times a day (TID) | INTRAVENOUS | Status: DC
  Administered 2021-01-19 – 2021-01-21 (×5): 2 g via INTRAVENOUS
  Filled 2021-01-19 (×7): qty 2

## 2021-01-19 MED ORDER — POTASSIUM CHLORIDE CRYS ER 20 MEQ PO TBCR
40.0000 meq | EXTENDED_RELEASE_TABLET | Freq: Once | ORAL | Status: AC
  Administered 2021-01-19: 40 meq via ORAL
  Filled 2021-01-19: qty 2

## 2021-01-19 MED ORDER — VANCOMYCIN HCL 750 MG/150ML IV SOLN
750.0000 mg | Freq: Two times a day (BID) | INTRAVENOUS | Status: DC
  Administered 2021-01-19 – 2021-01-21 (×5): 750 mg via INTRAVENOUS
  Filled 2021-01-19 (×6): qty 150

## 2021-01-19 MED ORDER — VANCOMYCIN HCL 1000 MG/200ML IV SOLN: 1000.0000 mg | Freq: Once | INTRAVENOUS | Status: DC

## 2021-01-19 MED ORDER — PIPERACILLIN-TAZOBACTAM 3.375 G IVPB
3.3750 g | Freq: Once | INTRAVENOUS | Status: AC
  Administered 2021-01-19: 3.375 g via INTRAVENOUS
  Filled 2021-01-19: qty 50

## 2021-01-19 MED ORDER — SODIUM CHLORIDE 0.9 % IV SOLN: Freq: Once | INTRAVENOUS | Status: AC

## 2021-01-19 MED ORDER — ONDANSETRON HCL 4 MG/2ML IJ SOLN
4.0000 mg | Freq: Four times a day (QID) | INTRAMUSCULAR | Status: DC | PRN
  Administered 2021-01-19: 4 mg via INTRAVENOUS
  Filled 2021-01-19: qty 2

## 2021-01-19 NOTE — Progress Notes (Addendum)
PROGRESS NOTE  Barbara Thompson [id number] DOB: [birth year] DOA: 01/18/2021 PCP: Elray Buba, MD  Brief History:  40 year old female with a history of methamphetamine and cocaine use and no other chronic documented medical problems presenting with left lower quadrant abdominal pain.  The patient states that she injected her left lower quadrant abdomen with methamphetamine about 3 weeks prior to this admission.  She states that about 2 weeks prior to the admission she noticed some erythema and induration that has gradually gotten worse.  She stated that it eventually " came to a head", and she tried to " prick" the boil with a safety plan 3 days prior to this admission.  Since then, the patient has noted increasing drainage and foul odor.  She denies any fevers, chills, chest pain, shortness breath, nausea, vomiting, diarrhea.  She has noticed increasing spreading erythema.  She denies any other sites of infection presently. In the emergency department, the patient was afebrile hemodynamically stable.  WBC 8.0, hemoglobin 11.6, platelets 200,000.  Lactic acid was 0.9.  The patient was started on vancomycin and given a dose of Zosyn.  General surgery was consulted to assist.  Assessment/Plan: Abdominal wall cellulitis -CT abdomen/pelvis -Continue vancomycin -Add cefepime -Case discussed with general surgery, Dr. Arnoldo Morale  Methamphetamine use -Cessation discussed       Status is: Inpatient  Remains inpatient appropriate because: severity of illness requiring IV abx and possible surgical debridement      Family Communication:   no Family at bedside  Consultants:  general surgery  Code Status:  FULL  DVT Prophylaxis:   Heparin   Procedures: As Listed in Progress Note Above  Antibiotics: Vanc 12/1>> Cefepime 12/1>>    Total time spent 35 minutes.  Greater than 50% spent face to face counseling and coordinating care.   Subjective: Patient denies fevers,  chills, headache, chest pain, dyspnea, nausea, vomiting, diarrhea, abdominal pain, dysuria, hematuria, hematochezia, and melena.   Objective: Vitals:   01/19/21 0404 01/19/21 0405 01/19/21 0450 01/19/21 1417  BP: (!) 119/59  (!) 123/58 (!) 101/48  Pulse:  74 61 64  Resp:  18 18 17   Temp:  97.7 F (36.5 C) 97.7 F (36.5 C) 97.9 F (36.6 C)  TempSrc:  Oral Oral Oral  SpO2:  95% 100% 100%  Weight:      Height:        Intake/Output Summary (Last 24 hours) at 01/19/2021 1513 Last data filed at 01/19/2021 1502 Gross per 24 hour  Intake 952.9 ml  Output --  Net 952.9 ml   Weight change:  Exam:  General:  Pt is alert, follows commands appropriately, not in acute distress HEENT: No icterus, No thrush, No neck mass, [place]/AT Cardiovascular: RRR, S1/S2, no rubs, no gallops Respiratory: CTA bilaterally, no wheezing, no crackles, no rhonchi Abdomen: Soft/+BS, non tender, non distended, no guarding;  + erythema and induration with necrotic wound LLQ.  See pics below Extremities: No edema, No lymphangitis, No petechiae, No rashes, no synovitis   Data Reviewed: I have personally reviewed following labs and imaging studies Basic Metabolic Panel: Recent Labs  Lab 01/17/21 2249 01/19/21 0049 01/19/21 0333  NA 137 138  --   K 3.0* 3.8  --   CL 101 101  --   CO2 28 28  --   GLUCOSE 106* 97  --   BUN 6 6  --   CREATININE 0.45 0.46  --   CALCIUM 8.4* 8.5*  --  MG  --   --  2.0  PHOS  --   --  3.2   Liver Function Tests: Recent Labs  Lab 01/19/21 0049  AST 11*  ALT 13  ALKPHOS 54  BILITOT 0.1*  PROT 6.4*  ALBUMIN 3.1*   No results for input(s): LIPASE, AMYLASE in the last 168 hours. No results for input(s): AMMONIA in the last 168 hours. Coagulation Profile: No results for input(s): INR, PROTIME in the last 168 hours. CBC: Recent Labs  Lab 01/17/21 2249 01/19/21 0049  WBC 8.0 8.0  NEUTROABS 4.9 5.2  HGB 11.6* 11.5*  HCT 35.4* 34.8*  MCV 82.1 82.3  PLT 200 227    Cardiac Enzymes: No results for input(s): CKTOTAL, CKMB, CKMBINDEX, TROPONINI in the last 168 hours. BNP: Invalid input(s): POCBNP CBG: No results for input(s): GLUCAP in the last 168 hours. HbA1C: No results for input(s): HGBA1C in the last 72 hours. Urine analysis:    Component Value Date/Time   COLORURINE YELLOW 09/23/2020 2100   APPEARANCEUR TURBID (A) 09/23/2020 2100   LABSPEC 1.030 09/23/2020 2100   PHURINE 5.0 09/23/2020 2100   GLUCOSEU NEGATIVE 09/23/2020 2100   HGBUR NEGATIVE 09/23/2020 2100   BILIRUBINUR NEGATIVE 09/23/2020 2100   KETONESUR 5 (A) 09/23/2020 2100   PROTEINUR 30 (A) 09/23/2020 2100   NITRITE NEGATIVE 09/23/2020 2100   LEUKOCYTESUR NEGATIVE 09/23/2020 2100   Sepsis Labs: @LABRCNTIP (procalcitonin:4,lacticidven:4) ) Recent Results (from the past 240 hour(s))  Culture, blood (routine x 2)     Status: None (Preliminary result)   Collection Time: 01/17/21 10:49 PM   Specimen: Left Antecubital; Blood  Result Value Ref Range Status   Specimen Description LEFT ANTECUBITAL  Final   Special Requests   Final    BOTTLES DRAWN AEROBIC AND ANAEROBIC Blood Culture results may not be optimal due to an inadequate volume of blood received in culture bottles   Culture   Final    NO GROWTH 2 DAYS Performed at [facility], [address], [place], [place] [address]    Report Status PENDING  Incomplete  Culture, blood (routine x 2)     Status: None (Preliminary result)   Collection Time: 01/17/21 10:49 PM   Specimen: Right Antecubital; Blood  Result Value Ref Range Status   Specimen Description RIGHT ANTECUBITAL  Final   Special Requests   Final    BOTTLES DRAWN AEROBIC AND ANAEROBIC Blood Culture results may not be optimal due to an inadequate volume of blood received in culture bottles   Culture   Final    NO GROWTH 2 DAYS Performed at [facility], [address], [place], [place] [address]    Report Status PENDING  Incomplete  Aerobic Culture w Gram Stain  (superficial specimen)     Status: None (Preliminary result)   Collection Time: 01/17/21 11:48 PM   Specimen: Abscess  Result Value Ref Range Status   Specimen Description   Final    ABSCESS Performed at [facility], [address] [place], [place] [address]    Special Requests   Final    NONE Performed at [facility], [address] [place], [place] [address]    Gram Stain   Final    FEW WBC PRESENT,BOTH PMN AND MONONUCLEAR ABUNDANT GRAM POSITIVE COCCI ABUNDANT GRAM NEGATIVE RODS ABUNDANT GRAM POSITIVE RODS    Culture   Final    CULTURE REINCUBATED FOR BETTER GROWTH Performed at [facility], [address], [place], [place] [address]  Report Status PENDING  Incomplete  Resp Panel by RT-PCR (Flu A&B, Covid) Abscess     Status: None   Collection Time: 01/17/21 11:52 PM   Specimen: Abscess; Nasopharyngeal(NP) swabs in vial transport medium  Result Value Ref Range Status   SARS Coronavirus 2 by RT PCR NEGATIVE NEGATIVE Final    Comment: (NOTE) SARS-CoV-2 target nucleic acids are NOT DETECTED.  The SARS-CoV-2 RNA is generally detectable in upper respiratory specimens during the acute phase of infection. The lowest concentration of SARS-CoV-2 viral copies this assay can detect is 138 copies/mL. A negative result does not preclude SARS-Cov-2 infection and should not be used as the sole basis for treatment or other patient management decisions. A negative result may occur with  improper specimen collection/handling, submission of specimen other than nasopharyngeal swab, presence of viral mutation(s) within the areas targeted by this assay, and inadequate number of viral copies(<138 copies/mL). A negative result must be combined with clinical observations, patient history, and epidemiological information. The expected result is Negative.  Fact Sheet for Patients:  EntrepreneurPulse.com.au  Fact Sheet for Healthcare Providers:   IncredibleEmployment.be  This test is no t yet approved or cleared by the [place] FDA and  has been authorized for detection and/or diagnosis of SARS-CoV-2 by FDA under an Emergency Use Authorization (EUA). This EUA will remain  in effect (meaning this test can be used) for the duration of the COVID-19 declaration under Section 564(b)(1) of the Act, 21 U.S.C.section 360bbb-3(b)(1), unless the authorization is terminated  or revoked sooner.       Influenza A by PCR NEGATIVE NEGATIVE Final   Influenza B by PCR NEGATIVE NEGATIVE Final    Comment: (NOTE) The Xpert Xpress SARS-CoV-2/FLU/RSV plus assay is intended as an aid in the diagnosis of influenza from Nasopharyngeal swab specimens and should not be used as a sole basis for treatment. Nasal washings and aspirates are unacceptable for Xpert Xpress SARS-CoV-2/FLU/RSV testing.  Fact Sheet for Patients: EntrepreneurPulse.com.au  Fact Sheet for Healthcare Providers: IncredibleEmployment.be  This test is not yet approved or cleared by the [place] FDA and has been authorized for detection and/or diagnosis of SARS-CoV-2 by FDA under an Emergency Use Authorization (EUA). This EUA will remain in effect (meaning this test can be used) for the duration of the COVID-19 declaration under Section 564(b)(1) of the Act, 21 U.S.C. section 360bbb-3(b)(1), unless the authorization is terminated or revoked.  Performed at [facility], [address] [place], [place] [address]   Resp Panel by RT-PCR (Flu A&B, Covid) Nasopharyngeal Swab     Status: None   Collection Time: 01/19/21  3:00 AM   Specimen: Nasopharyngeal Swab; Nasopharyngeal(NP) swabs in vial transport medium  Result Value Ref Range Status   SARS Coronavirus 2 by RT PCR NEGATIVE NEGATIVE Final    Comment: (NOTE) SARS-CoV-2 target nucleic acids are NOT DETECTED.  The SARS-CoV-2 RNA is generally detectable in upper  respiratory specimens during the acute phase of infection. The lowest concentration of SARS-CoV-2 viral copies this assay can detect is 138 copies/mL. A negative result does not preclude SARS-Cov-2 infection and should not be used as the sole basis for treatment or other patient management decisions. A negative result may occur with  improper specimen collection/handling, submission of specimen other than nasopharyngeal swab, presence of viral mutation(s) within the areas targeted by this assay, and inadequate number of viral copies(<138 copies/mL). A negative result must be combined with clinical observations, patient history, and epidemiological information. The expected result is Negative.  Fact Sheet for Patients:  BloggerCourse.com  Fact Sheet for Healthcare Providers:  SeriousBroker.it  This test is no t yet approved or cleared by the [place] FDA and  has been authorized for detection and/or diagnosis of SARS-CoV-2 by FDA under an Emergency Use Authorization (EUA). This EUA will remain  in effect (meaning this test can be used) for the duration of the COVID-19 declaration under Section 564(b)(1) of the Act, 21 U.S.C.section 360bbb-3(b)(1), unless the authorization is terminated  or revoked sooner.       Influenza A by PCR NEGATIVE NEGATIVE Final   Influenza B by PCR NEGATIVE NEGATIVE Final    Comment: (NOTE) The Xpert Xpress SARS-CoV-2/FLU/RSV plus assay is intended as an aid in the diagnosis of influenza from Nasopharyngeal swab specimens and should not be used as a sole basis for treatment. Nasal washings and aspirates are unacceptable for Xpert Xpress SARS-CoV-2/FLU/RSV testing.  Fact Sheet for Patients: BloggerCourse.com  Fact Sheet for Healthcare Providers: SeriousBroker.it  This test is not yet approved or cleared by the [place] FDA and has been  authorized for detection and/or diagnosis of SARS-CoV-2 by FDA under an Emergency Use Authorization (EUA). This EUA will remain in effect (meaning this test can be used) for the duration of the COVID-19 declaration under Section 564(b)(1) of the Act, 21 U.S.C. section 360bbb-3(b)(1), unless the authorization is terminated or revoked.  Performed at [facility], [address] [place], [place] [address]      Scheduled Meds:  [START ON 01/20/2021] influenza vac split quadrivalent PF  0.5 mL Intramuscular Tomorrow-1000   Continuous Infusions:  vancomycin Stopped (01/19/21 0948)    Procedures/Studies: No results found.  Catarina Hartshorn, DO  [facility]  If 7PM-7AM, please contact night-coverage [email or web address] Password [email or web address] 01/19/2021, 3:13 PM   LOS: 0 days

## 2021-01-19 NOTE — Consult Note (Signed)
Reason for Consult: Abdominal wall abscess Referring Physician: Dr. Arbutus Leas  Barbara Thompson is an 40 y.o. female.  HPI: Patient is a 40 year old white female who presented to the emergency room with cellulitis and an abscess of the abdominal wall.  She has an illicit drug issue and states that she injected meth into the abdominal wall.  She developed an abscess and pain at the wound.  She was admitted by the hospitalist for further evaluation and treatment.  Past Medical History:  Diagnosis Date   Gestational diabetes    Preeclampsia     Past Surgical History:  Procedure Laterality Date   CESAREAN SECTION      No family history on file.  Social History:  reports that she has never smoked. She has never used smokeless tobacco. She reports current drug use. Drugs: IV and Methamphetamines. She reports that she does not drink alcohol.  Allergies: No Known Allergies  Medications: I have reviewed the patient's current medications.  Results for orders placed or performed during the hospital encounter of 01/18/21 (from the past 48 hour(s))  Comprehensive metabolic panel     Status: Abnormal   Collection Time: 01/19/21 12:49 AM  Result Value Ref Range   Sodium 138 135 - 145 mmol/L   Potassium 3.8 3.5 - 5.1 mmol/L    Comment: DELTA CHECK NOTED   Chloride 101 98 - 111 mmol/L   CO2 28 22 - 32 mmol/L   Glucose, Bld 97 70 - 99 mg/dL    Comment: Glucose reference range applies only to samples taken after fasting for at least 8 hours.   BUN 6 6 - 20 mg/dL   Creatinine, Ser 9.16 0.44 - 1.00 mg/dL   Calcium 8.5 (L) 8.9 - 10.3 mg/dL   Total Protein 6.4 (L) 6.5 - 8.1 g/dL   Albumin 3.1 (L) 3.5 - 5.0 g/dL   AST 11 (L) 15 - 41 U/L   ALT 13 0 - 44 U/L   Alkaline Phosphatase 54 38 - 126 U/L   Total Bilirubin 0.1 (L) 0.3 - 1.2 mg/dL   GFR, Estimated >94 >50 mL/min    Comment: (NOTE) Calculated using the CKD-EPI Creatinine Equation (2021)    Anion gap 9 5 - 15    Comment: Performed at [facility], [address], [place], [place] [address]  Lactic acid, plasma     Status: None   Collection Time: 01/19/21 12:49 AM  Result Value Ref Range   Lactic Acid, Venous 0.9 0.5 - 1.9 mmol/L    Comment: Performed at [facility], [address], [place], [place] [address]  CBC with Differential     Status: Abnormal   Collection Time: 01/19/21 12:49 AM  Result Value Ref Range   WBC 8.0 4.0 - 10.5 K/uL   RBC 4.23 3.87 - 5.11 MIL/uL   Hemoglobin 11.5 (L) 12.0 - 15.0 g/dL   HCT 91.7 (L) 91.5 - 05.6 %   MCV 82.3 80.0 - 100.0 fL   MCH 27.2 26.0 - 34.0 pg   MCHC 33.0 30.0 - 36.0 g/dL   RDW 97.9 48.0 - 16.5 %   Platelets 227 150 - 400 K/uL   nRBC 0.0 0.0 - 0.2 %   Neutrophils Relative % 65 %   Neutro Abs 5.2 1.7 - 7.7 K/uL   Lymphocytes Relative 29 %   Lymphs Abs 2.3 0.7 - 4.0 K/uL   Monocytes Relative 5 %   Monocytes Absolute 0.4 0.1 - 1.0 K/uL   Eosinophils Relative  1 %   Eosinophils Absolute 0.1 0.0 - 0.5 K/uL   Basophils Relative 0 %   Basophils Absolute 0.0 0.0 - 0.1 K/uL   Immature Granulocytes 0 %   Abs Immature Granulocytes 0.02 0.00 - 0.07 K/uL    Comment: Performed at [facility], [address], [place], [place] [address]  Resp Panel by RT-PCR (Flu A&B, Covid) Nasopharyngeal Swab     Status: None   Collection Time: 01/19/21  3:00 AM   Specimen: Nasopharyngeal Swab; Nasopharyngeal(NP) swabs in vial transport medium  Result Value Ref Range   SARS Coronavirus 2 by RT PCR NEGATIVE NEGATIVE    Comment: (NOTE) SARS-CoV-2 target nucleic acids are NOT DETECTED.  The SARS-CoV-2 RNA is generally detectable in upper respiratory specimens during the acute phase of infection. The lowest concentration of SARS-CoV-2 viral copies this assay can detect is 138 copies/mL. A negative result does not preclude SARS-Cov-2 infection and should not be used as the sole basis for treatment or other patient management decisions. A negative result may occur with  improper specimen collection/handling,  submission of specimen other than nasopharyngeal swab, presence of viral mutation(s) within the areas targeted by this assay, and inadequate number of viral copies(<138 copies/mL). A negative result must be combined with clinical observations, patient history, and epidemiological information. The expected result is Negative.  Fact Sheet for Patients:  BloggerCourse.com  Fact Sheet for Healthcare Providers:  SeriousBroker.it  This test is no t yet approved or cleared by the [place] FDA and  has been authorized for detection and/or diagnosis of SARS-CoV-2 by FDA under an Emergency Use Authorization (EUA). This EUA will remain  in effect (meaning this test can be used) for the duration of the COVID-19 declaration under Section 564(b)(1) of the Act, 21 U.S.C.section 360bbb-3(b)(1), unless the authorization is terminated  or revoked sooner.       Influenza A by PCR NEGATIVE NEGATIVE   Influenza B by PCR NEGATIVE NEGATIVE    Comment: (NOTE) The Xpert Xpress SARS-CoV-2/FLU/RSV plus assay is intended as an aid in the diagnosis of influenza from Nasopharyngeal swab specimens and should not be used as a sole basis for treatment. Nasal washings and aspirates are unacceptable for Xpert Xpress SARS-CoV-2/FLU/RSV testing.  Fact Sheet for Patients: BloggerCourse.com  Fact Sheet for Healthcare Providers: SeriousBroker.it  This test is not yet approved or cleared by the [place] FDA and has been authorized for detection and/or diagnosis of SARS-CoV-2 by FDA under an Emergency Use Authorization (EUA). This EUA will remain in effect (meaning this test can be used) for the duration of the COVID-19 declaration under Section 564(b)(1) of the Act, 21 U.S.C. section 360bbb-3(b)(1), unless the authorization is terminated or revoked.  Performed at [facility], [address],  [place], [place] [address]   Lactic acid, plasma     Status: None   Collection Time: 01/19/21  3:33 AM  Result Value Ref Range   Lactic Acid, Venous 0.7 0.5 - 1.9 mmol/L    Comment: Performed at [facility], [address], [place], [place] [address]  HIV Antibody (routine testing w rflx)     Status: None   Collection Time: 01/19/21  3:33 AM  Result Value Ref Range   HIV Screen 4th Generation wRfx Non Reactive Non Reactive    Comment: Performed at [facility], [address] [place], [place] [address]  Magnesium     Status: None   Collection Time: 01/19/21  3:33 AM  Result Value Ref  Range   Magnesium 2.0 1.7 - 2.4 mg/dL    Comment: Performed at [facility], [address] [place], [place] [address]  Phosphorus     Status: None   Collection Time: 01/19/21  3:33 AM  Result Value Ref Range   Phosphorus 3.2 2.5 - 4.6 mg/dL    Comment: Performed at [facility], [address] [place], [place] [address]    No results found.  ROS:  Pertinent items are noted in HPI.  Blood pressure (!) 101/48, pulse 64, temperature 97.9 F (36.6 C), temperature source Oral, resp. rate 17, height 5\' 1"  (1.549 m), weight 79.8 kg, SpO2 100 %, unknown if currently breastfeeding. Physical Exam: White female no acute distress Head is normocephalic, atraumatic Abdomen is soft.  A 4 to 5 cm ovoid anterior left abdominal wound with skin loss down to the subcutaneous tissue.  Some thick eschar is present.  Very tender to touch.   Assessment/Plan: Impression: Abdominal wound cellulitis secondary to meth injection.  Some eschars present. Plan: We will start Santyl enzymatic to dissolve the eschar.  Further management is pending those results.  Seen with Dr. .  Arbutus Leas 01/19/2021, 3:17 PM

## 2021-01-19 NOTE — ED Provider Notes (Signed)
[facility] EMERGENCY DEPARTMENT Provider Note   CSN: [id number] Arrival date & time: 01/18/21  2206     History Chief Complaint  Patient presents with   Wound Infection    Barbara Thompson is a 40 y.o. female.  Patient is a 40 year old female with no significant past medical history.  She presents today with complaints of a wound/infection to the left lower quadrant of her abdomen.  This has been worsening over the past week.  Patient states that it came to ahead and began draining several days ago, but now it is grayish-black in appearance.  She denies any fevers or chills.  Patient was seen here yesterday with similar complaints and admission was recommended, but patient left AGAINST MEDICAL ADVICE due to family obligations.  She returns with worsening pain.  The history is provided by the patient.      Past Medical History:  Diagnosis Date   Gestational diabetes    Preeclampsia     Patient Active Problem List   Diagnosis Date Noted   Abdominal wall abscess 01/18/2021    Past Surgical History:  Procedure Laterality Date   CESAREAN SECTION       OB History     Gravida  1   Para      Term      Preterm      AB      Living         SAB      IAB      Ectopic      Multiple      Live Births              No family history on file.  Social History   Tobacco Use   Smoking status: Never   Smokeless tobacco: Never  Vaping Use   Vaping Use: Never used  Substance Use Topics   Alcohol use: No    Comment: occ   Drug use: Yes    Types: IV, Methamphetamines    Comment: meth used 1- 1/2 weeks ago (01/18/2021)    Home Medications Prior to Admission medications   Medication Sig Start Date End Date Taking? Authorizing Provider  cyclobenzaprine (FLEXERIL) 10 MG tablet Take 1 tablet (10 mg total) by mouth 2 (two) times daily as needed for muscle spasms. Patient not taking: Reported on 01/12/2021 09/23/20   Carroll Sage, PA-C   diphenhydrAMINE-PSE-APAP (BENADRYL ALLERGY/COLD PO) Take 2 tablets by mouth every 6 (six) hours as needed (allergies). Patient not taking: Reported on 01/12/2021    [provider]  SUBOXONE 8-2 MG FILM Take 0.5 strips by mouth 4 (four) times daily. 01/09/21   [provider]    Allergies    Patient has no known allergies.  Review of Systems   Review of Systems  All other systems reviewed and are negative.  Physical Exam Updated Vital Signs BP 133/77 (BP Location: Right Arm)   Pulse 89   Temp 97.6 F (36.4 C) (Oral)   Resp 17   Ht 5\' 1"  (1.549 m)   Wt 79.8 kg   SpO2 100%   BMI 33.26 kg/m   Physical Exam Vitals and nursing note reviewed.  Constitutional:      General: She is not in acute distress.    Appearance: She is well-developed. She is not diaphoretic.  HENT:     Head: Normocephalic and atraumatic.  Cardiovascular:     Rate and Rhythm: Normal rate and regular rhythm.     Heart  sounds: No murmur heard.   No friction rub. No gallop.  Pulmonary:     Effort: Pulmonary effort is normal. No respiratory distress.     Breath sounds: Normal breath sounds. No wheezing.  Abdominal:     General: Bowel sounds are normal. There is no distension.     Palpations: Abdomen is soft.     Tenderness: There is no abdominal tenderness.  Musculoskeletal:        General: Normal range of motion.     Cervical back: Normal range of motion and neck supple.  Skin:    General: Skin is warm and dry.     Comments: There is an area of erythema surrounding a necrotic 3cm x 5cm area of tissue to the left lower quadrant of the abdomen.  There is foul-smelling drainage and necrotic tissue.  Neurological:     General: No focal deficit present.     Mental Status: She is alert and oriented to person, place, and time.    ED Results / Procedures / Treatments   Labs (all labs ordered are listed, but only abnormal results are displayed) Labs Reviewed  COMPREHENSIVE METABOLIC  PANEL  LACTIC ACID, PLASMA  LACTIC ACID, PLASMA  CBC WITH DIFFERENTIAL/PLATELET    EKG None  Radiology No results found.  Procedures Procedures   Medications Ordered in ED Medications  vancomycin (VANCOREADY) IVPB 1500 mg/300 mL (has no administration in time range)  piperacillin-tazobactam (ZOSYN) IVPB 3.375 g (has no administration in time range)  ondansetron (ZOFRAN) injection 4 mg (has no administration in time range)  morphine 4 MG/ML injection 4 mg (has no administration in time range)  sodium chloride 0.9 % bolus 500 mL (has no administration in time range)    ED Course  I have reviewed the triage vital signs and the nursing notes.  Pertinent labs & imaging results that were available during my care of the patient were reviewed by me and considered in my medical decision making (see chart for details).    MDM Rules/Calculators/A&P  Patient returns to the ER after leaving AMA yesterday with a necrotic, cellulitic wound to the left lower abdomen.  Patient started on IV vancomycin and IV Zosyn and patient will be admitted to the hospitalist service for IV antibiotics and further wound care.  Final Clinical Impression(s) / ED Diagnoses Final diagnoses:  None    Rx / DC Orders ED Discharge Orders     None        Geoffery Lyons, MD 01/19/21 [phone]

## 2021-01-19 NOTE — Progress Notes (Addendum)
Pharmacy Antibiotic Note  Barbara Thompson is a 40 y.o. female admitted on 01/18/2021 with cellulitis.  Pharmacy has been consulted for Vancomycin dosing.  Plan: Vancomycin 750 mg IV q12h  Height: 5\' 1"  (154.9 cm) Weight: 79.8 kg (176 lb) IBW/kg (Calculated) : 47.8  Temp (24hrs), Avg:97.7 F (36.5 C), Min:97.6 F (36.4 C), Max:97.7 F (36.5 C)  Recent Labs  Lab 01/17/21 2249 01/19/21 0049 01/19/21 0333  WBC 8.0 8.0  --   CREATININE 0.45 0.46  --   LATICACIDVEN 0.9 0.9 0.7     Estimated Creatinine Clearance: 87.6 mL/min (by C-G formula based on SCr of 0.46 mg/dL).    No Known Allergies  14/02/22, PharmD, BCPS

## 2021-01-19 NOTE — H&P (Signed)
History and Physical  Inga Noller GNF:[id number] DOB: 1980/04/02 DOA: 01/18/2021  Referring physician: Geoffery Lyons, MD PCP: Cleophus Molt, MD  Patient coming from: Home  Chief Complaint: Abdominal wall wound infection  HPI: Barbara Thompson is a 40 y.o. female with no significant medical history who presents to the emergency department due to pain from the wound in the left lower abdominal area.  She complained of 1 week onset of a boil which progressively worsened and turned to an abscess which started draining a few days ago, she now complains of the abscess turning grayish-black with foul-smelling odor and pain.  She presented to the maintenance appointment yesterday, but left AMA, she now returns with worsening pain.  She denies fever, chills, chest pain, shortness of breath, headache, nausea or vomiting  ED Course:  In the emergency department, she was hemodynamically stable, CBC was normal, BMP was normal except for hypokalemia.  Lactic acid was 0.9 > 0.7.  Magnesium and phosphorus were normal.  Influenza A, B, SARS coronavirus 2 was negative. Patient was started on IV Vanco and Zosyn, IV morphine was given.  Hospitalist was asked to admit patient for further evaluation and management.  Review of Systems: Constitutional: Negative for chills and fever.  HENT: Negative for ear pain and sore throat.   Eyes: Negative for pain and visual disturbance.  Respiratory: Negative for cough, chest tightness and shortness of breath.   Cardiovascular: Negative for chest pain and palpitations.  Gastrointestinal: Positive for left lower abdominal wall pain and negative for vomiting.  Endocrine: Negative for polyphagia and polyuria.  Genitourinary: Negative for decreased urine volume, dysuria, enuresis Musculoskeletal: Negative for arthralgias and back pain.  Skin: Negative for color change and rash.  Allergic/Immunologic: Negative for immunocompromised state.  Neurological: Negative for tremors,  syncope, speech difficulty, weakness, light-headedness and headaches.  Hematological: Does not bruise/bleed easily.  All other systems reviewed and are negative   Past Medical History:  Diagnosis Date   Gestational diabetes    Preeclampsia    Past Surgical History:  Procedure Laterality Date   CESAREAN SECTION      Social History:  reports that she has never smoked. She has never used smokeless tobacco. She reports current drug use. Drugs: IV and Methamphetamines. She reports that she does not drink alcohol.   No Known Allergies  No family history on file.   Prior to Admission medications   Medication Sig Start Date End Date Taking? Authorizing Provider  cyclobenzaprine (FLEXERIL) 10 MG tablet Take 1 tablet (10 mg total) by mouth 2 (two) times daily as needed for muscle spasms. Patient not taking: Reported on 01/12/2021 09/23/20   Carroll Sage, PA-C  diphenhydrAMINE-PSE-APAP (BENADRYL ALLERGY/COLD PO) Take 2 tablets by mouth every 6 (six) hours as needed (allergies). Patient not taking: Reported on 01/12/2021    [provider]  SUBOXONE 8-2 MG FILM Take 0.5 strips by mouth 4 (four) times daily. 01/09/21   [provider]    Physical Exam: BP 113/66   Pulse 71   Temp 97.6 F (36.4 C) (Oral)   Resp 18   Ht 5\' 1"  (1.549 m)   Wt 79.8 kg   SpO2 98%   BMI 33.26 kg/m   General: 40 y.o. year-old female well developed well nourished in no acute distress.  Alert and oriented x3. HEENT: NCAT, EOMI Neck: Supple, trachea medial Cardiovascular: Regular rate and rhythm with no rubs or gallops.  No thyromegaly or JVD noted.  No lower extremity edema. 2/4 pulses  in all 4 extremities. Respiratory: Clear to auscultation with no wheezes or rales. Good inspiratory effort. Abdomen: Soft, nontender nondistended with normal bowel sounds x4 quadrants. Muskuloskeletal: No cyanosis, clubbing or edema noted bilaterally Neuro: CN II-XII intact, strength 5/5 x 4,  sensation, reflexes intact Skin: Wound (about 3 cm x 5cm) with necrotic tissue was noted in the LLQ of the abdomen with foul-smelling drainage and surrounding erythema.   Psychiatry: Judgement and insight appear normal. Mood is appropriate for condition and setting          Labs on Admission:  Basic Metabolic Panel: Recent Labs  Lab 01/17/21 2249 01/19/21 0049  NA 137 138  K 3.0* 3.8  CL 101 101  CO2 28 28  GLUCOSE 106* 97  BUN 6 6  CREATININE 0.45 0.46  CALCIUM 8.4* 8.5*   Liver Function Tests: Recent Labs  Lab 01/19/21 0049  AST 11*  ALT 13  ALKPHOS 54  BILITOT 0.1*  PROT 6.4*  ALBUMIN 3.1*   No results for input(s): LIPASE, AMYLASE in the last 168 hours. No results for input(s): AMMONIA in the last 168 hours. CBC: Recent Labs  Lab 01/17/21 2249 01/19/21 0049  WBC 8.0 8.0  NEUTROABS 4.9 5.2  HGB 11.6* 11.5*  HCT 35.4* 34.8*  MCV 82.1 82.3  PLT 200 227   Cardiac Enzymes: No results for input(s): CKTOTAL, CKMB, CKMBINDEX, TROPONINI in the last 168 hours.  BNP (last 3 results) No results for input(s): BNP in the last 8760 hours.  ProBNP (last 3 results) No results for input(s): PROBNP in the last 8760 hours.  CBG: No results for input(s): GLUCAP in the last 168 hours.  Radiological Exams on Admission: No results found.  EKG: I independently viewed the EKG done and my findings are as followed: EKG was not done in the ED  Assessment/Plan Present on Admission:  Abdominal wall abscess  Principal Problem:   Abdominal wall abscess  Abdominal wall abscess with surrounding cellulitis Patient was started on IV vancomycin and Zosyn, we shall continue with vancomycin. Continue IV Dilaudid 0.5 mg every 4 hours as needed Continue IV hydration Continue wound care  Hypokalemia K+ 3.0, this will be replenished   DVT prophylaxis: SCD  Code Status: Full code  Family Communication: None at bedside  Disposition Plan:  Patient is from:                         home Anticipated DC to:                   SNF or family members home Anticipated DC date:               2-3 days Anticipated DC barriers:          Patient requires inpatient management due to abdominal wall abscess with surrounding cellulitis requiring surgical consult and IV antibiotics  Consults called: General surgery  Admission status: Inpatient    Bernadette Hoit MD [facility]  01/19/2021, 3:09 AM

## 2021-01-20 LAB — AEROBIC CULTURE W GRAM STAIN (SUPERFICIAL SPECIMEN)

## 2021-01-20 LAB — CBC
HCT: 36.9 % (ref 36.0–46.0)
Hemoglobin: 11.4 g/dL — ABNORMAL LOW (ref 12.0–15.0)
MCH: 26.3 pg (ref 26.0–34.0)
MCHC: 30.9 g/dL (ref 30.0–36.0)
MCV: 85.2 fL (ref 80.0–100.0)
Platelets: 225 10*3/uL (ref 150–400)
RBC: 4.33 MIL/uL (ref 3.87–5.11)
RDW: 14 % (ref 11.5–15.5)
WBC: 8.3 10*3/uL (ref 4.0–10.5)
nRBC: 0 % (ref 0.0–0.2)

## 2021-01-20 MED ORDER — MUPIROCIN 2 % EX OINT
1.0000 "application " | TOPICAL_OINTMENT | Freq: Two times a day (BID) | CUTANEOUS | Status: DC
  Administered 2021-01-20 – 2021-01-21 (×3): 1 via TOPICAL
  Filled 2021-01-20: qty 22

## 2021-01-20 NOTE — Progress Notes (Signed)
Abdominal wound inspected at bedside.  The soft eschar peeled off nicely.  We will stop Santyl and start Bactroban cream.  Surrounding cellulitis slightly less.  Continue antibiotics per Dr. Arbutus Leas.  CT scan of abdomen results noted.    Media Information Document Information  Photos    01/20/2021 09:32  Attached To:  Hospital Encounter on 01/18/21   Source Information  Franky Macho, MD  Ap-Dept 300

## 2021-01-20 NOTE — Progress Notes (Signed)
Pharmacy Antibiotic Note  Barbara Thompson is a 40 y.o. female admitted on 01/18/2021 with cellulitis.  Pharmacy has been consulted for Vancomycin dosing. WBC WNL. Renal function good.   Plan: Vancomycin 1500 mg IV x 1, then 1250 mg IV q24h >>>Estimated AUC: 506 Trend WBC, temp, renal function  F/U infectious work-up Drug levels as indicated   Height: 5\' 1"  (154.9 cm) Weight: 79.8 kg (176 lb) IBW/kg (Calculated) : 47.8  Temp (24hrs), Avg:97.9 F (36.6 C), Min:97.8 F (36.6 C), Max:97.9 F (36.6 C)  Recent Labs  Lab 01/17/21 2249 01/19/21 0049 01/19/21 0333 01/20/21 0433  WBC 8.0 8.0  --  8.3  CREATININE 0.45 0.46  --   --   LATICACIDVEN 0.9 0.9 0.7  --      Estimated Creatinine Clearance: 87.6 mL/min (by C-G formula based on SCr of 0.46 mg/dL).    No Known Allergies  14/03/22, PharmD Clinical Pharmacist

## 2021-01-20 NOTE — Plan of Care (Signed)
  Problem: Clinical Measurements: Goal: Will remain free from infection Outcome: Not Progressing   Problem: Clinical Measurements: Goal: Diagnostic test results will improve Outcome: Not Progressing   Problem: Elimination: Goal: Will not experience complications related to bowel motility Outcome: Not Progressing   Problem: Pain Managment: Goal: General experience of comfort will improve Outcome: Not Progressing   Problem: Skin Integrity: Goal: Risk for impaired skin integrity will decrease Outcome: Not Progressing

## 2021-01-20 NOTE — Progress Notes (Signed)
PROGRESS NOTE  Barbara Thompson [id number] DOB: 1980-02-25 DOA: 01/18/2021 PCP: Elray Buba, MD  Brief History:  40 year old female with a history of methamphetamine and cocaine use and no other chronic documented medical problems presenting with left lower quadrant abdominal pain.  The patient states that she injected her left lower quadrant abdomen with methamphetamine about 3 weeks prior to this admission.  She states that about 2 weeks prior to the admission she noticed some erythema and induration that has gradually gotten worse.  She stated that it eventually " came to a head", and she tried to " prick" the boil with a safety plan 3 days prior to this admission.  Since then, the patient has noted increasing drainage and foul odor.  She denies any fevers, chills, chest pain, shortness breath, nausea, vomiting, diarrhea.  She has noticed increasing spreading erythema.  She denies any other sites of infection presently. In the emergency department, the patient was afebrile hemodynamically stable.  WBC 8.0, hemoglobin 11.6, platelets 200,000.  Lactic acid was 0.9.  The patient was started on vancomycin and given a dose of Zosyn.  General surgery was consulted to assist.   Assessment/Plan: Abdominal wall cellulitis -CT abdomen/pelvis--Left lower anterior abdominal wall cutaneous skin defect with subjacent inflammation/edema without drainable/walled off fluid collection. -Continue vancomycin -Add cefepime -Case discussed with general surgery, Dr. Arnoldo Morale   Methamphetamine use -Cessation discussed   R-breast nodularity -incidental finding on CT -outpatient mammogram           Status is: Inpatient   Remains inpatient appropriate because: severity of illness requiring IV abx and possible surgical debridement           Family Communication:   no Family at bedside   Consultants:  general surgery   Code Status:  FULL   DVT Prophylaxis:  [place] Heparin      Procedures: As Listed in Progress Note Above   Antibiotics: Vanc 12/1>> Cefepime 12/1>>   Subjective: Patient denies fevers, chills, headache, chest pain, dyspnea, nausea, vomiting, diarrhea, abdominal pain, dysuria, hematuria, hematochezia, and melena.   Objective: Vitals:   01/19/21 0450 01/19/21 1417 01/19/21 2059 01/20/21 1411  BP: (!) 123/58 (!) 101/48 (!) 111/55 (!) 100/55  Pulse: 61 64 62 60  Resp: 18 17 16 19   Temp: 97.7 F (36.5 C) 97.9 F (36.6 C) 97.8 F (36.6 C) 98 F (36.7 C)  TempSrc: Oral Oral Oral Oral  SpO2: 100% 100% 100% 100%  Weight:      Height:        Intake/Output Summary (Last 24 hours) at 01/20/2021 1625 Last data filed at 01/20/2021 1200 Gross per 24 hour  Intake 2086.98 ml  Output --  Net 2086.98 ml   Weight change:  Exam:  General:  Pt is alert, follows commands appropriately, not in acute distress HEENT: No icterus, No thrush, No neck mass, [place]/AT Cardiovascular: RRR, S1/S2, no rubs, no gallops Respiratory: CTA bilaterally, no wheezing, no crackles, no rhonchi Abdomen: Soft/+BS, erythema and induration LLQ with skin defect in LLQ Extremities: No edema, No lymphangitis, No petechiae, No rashes, no synovitis   Data Reviewed: I have personally reviewed following labs and imaging studies Basic Metabolic Panel: Recent Labs  Lab 01/17/21 2249 01/19/21 0049 01/19/21 0333  NA 137 138  --   K 3.0* 3.8  --   CL 101 101  --   CO2 28 28  --   GLUCOSE 106* 97  --  BUN 6 6  --   CREATININE 0.45 0.46  --   CALCIUM 8.4* 8.5*  --   MG  --   --  2.0  PHOS  --   --  3.2   Liver Function Tests: Recent Labs  Lab 01/19/21 0049  AST 11*  ALT 13  ALKPHOS 54  BILITOT 0.1*  PROT 6.4*  ALBUMIN 3.1*   No results for input(s): LIPASE, AMYLASE in the last 168 hours. No results for input(s): AMMONIA in the last 168 hours. Coagulation Profile: No results for input(s): INR, PROTIME in the last 168 hours. CBC: Recent Labs  Lab  01/17/21 2249 01/19/21 0049 01/20/21 0433  WBC 8.0 8.0 8.3  NEUTROABS 4.9 5.2  --   HGB 11.6* 11.5* 11.4*  HCT 35.4* 34.8* 36.9  MCV 82.1 82.3 85.2  PLT 200 227 225   Cardiac Enzymes: No results for input(s): CKTOTAL, CKMB, CKMBINDEX, TROPONINI in the last 168 hours. BNP: Invalid input(s): POCBNP CBG: No results for input(s): GLUCAP in the last 168 hours. HbA1C: No results for input(s): HGBA1C in the last 72 hours. Urine analysis:    Component Value Date/Time   COLORURINE YELLOW 09/23/2020 2100   APPEARANCEUR TURBID (A) 09/23/2020 2100   LABSPEC 1.030 09/23/2020 2100   PHURINE 5.0 09/23/2020 2100   GLUCOSEU NEGATIVE 09/23/2020 2100   HGBUR NEGATIVE 09/23/2020 2100   BILIRUBINUR NEGATIVE 09/23/2020 2100   KETONESUR 5 (A) 09/23/2020 2100   PROTEINUR 30 (A) 09/23/2020 2100   NITRITE NEGATIVE 09/23/2020 2100   LEUKOCYTESUR NEGATIVE 09/23/2020 2100   Sepsis Labs: @LABRCNTIP (procalcitonin:4,lacticidven:4) ) Recent Results (from the past 240 hour(s))  Culture, blood (routine x 2)     Status: None (Preliminary result)   Collection Time: 01/17/21 10:49 PM   Specimen: Left Antecubital; Blood  Result Value Ref Range Status   Specimen Description LEFT ANTECUBITAL  Final   Special Requests   Final    BOTTLES DRAWN AEROBIC AND ANAEROBIC Blood Culture results may not be optimal due to an inadequate volume of blood received in culture bottles   Culture   Final    NO GROWTH 3 DAYS Performed at [facility], [address], [place], [place]    Report Status PENDING  Incomplete  Culture, blood (routine x 2)     Status: None (Preliminary result)   Collection Time: 01/17/21 10:49 PM   Specimen: Right Antecubital; Blood  Result Value Ref Range Status   Specimen Description RIGHT ANTECUBITAL  Final   Special Requests   Final    BOTTLES DRAWN AEROBIC AND ANAEROBIC Blood Culture results may not be optimal due to an inadequate volume of blood received in culture bottles    Culture   Final    NO GROWTH 3 DAYS Performed at [facility], [address], [place], [place]    Report Status PENDING  Incomplete  Aerobic Culture w Gram Stain (superficial specimen)     Status: None   Collection Time: 01/17/21 11:48 PM   Specimen: Abscess  Result Value Ref Range Status   Specimen Description   Final    ABSCESS Performed at [facility], [address], [place], [place]    Special Requests   Final    NONE Performed at [facility], [address], [place], [place]    Gram Stain   Final    FEW WBC PRESENT,BOTH PMN AND MONONUCLEAR ABUNDANT GRAM POSITIVE COCCI ABUNDANT GRAM NEGATIVE RODS ABUNDANT GRAM POSITIVE RODS  Culture   Final    MODERATE STREPTOCOCCUS GROUP C Beta hemolytic streptococci are predictably susceptible to penicillin and other beta lactams. Susceptibility testing not routinely performed. WITHIN MIXED ORGANISMS Performed at [facility], [address] [place], [place] [address]    Report Status 01/20/2021 FINAL  Final  Resp Panel by RT-PCR (Flu A&B, Covid) Abscess     Status: None   Collection Time: 01/17/21 11:52 PM   Specimen: Abscess; Nasopharyngeal(NP) swabs in vial transport medium  Result Value Ref Range Status   SARS Coronavirus 2 by RT PCR NEGATIVE NEGATIVE Final    Comment: (NOTE) SARS-CoV-2 target nucleic acids are NOT DETECTED.  The SARS-CoV-2 RNA is generally detectable in upper respiratory specimens during the acute phase of infection. The lowest concentration of SARS-CoV-2 viral copies this assay can detect is 138 copies/mL. A negative result does not preclude SARS-Cov-2 infection and should not be used as the sole basis for treatment or other patient management decisions. A negative result may occur with  improper specimen collection/handling, submission of specimen other than nasopharyngeal swab, presence of viral mutation(s) within the areas targeted by this assay, and inadequate  number of viral copies(<138 copies/mL). A negative result must be combined with clinical observations, patient history, and epidemiological information. The expected result is Negative.  Fact Sheet for Patients:  BloggerCourse.comhttps://www.fda.gov/media/152166/download  Fact Sheet for Healthcare Providers:  SeriousBroker.ithttps://www.fda.gov/media/152162/download  This test is no t yet approved or cleared by the [place] FDA and  has been authorized for detection and/or diagnosis of SARS-CoV-2 by FDA under an Emergency Use Authorization (EUA). This EUA will remain  in effect (meaning this test can be used) for the duration of the COVID-19 declaration under Section 564(b)(1) of the Act, 21 U.S.C.section 360bbb-3(b)(1), unless the authorization is terminated  or revoked sooner.       Influenza A by PCR NEGATIVE NEGATIVE Final   Influenza B by PCR NEGATIVE NEGATIVE Final    Comment: (NOTE) The Xpert Xpress SARS-CoV-2/FLU/RSV plus assay is intended as an aid in the diagnosis of influenza from Nasopharyngeal swab specimens and should not be used as a sole basis for treatment. Nasal washings and aspirates are unacceptable for Xpert Xpress SARS-CoV-2/FLU/RSV testing.  Fact Sheet for Patients: BloggerCourse.comhttps://www.fda.gov/media/152166/download  Fact Sheet for Healthcare Providers: SeriousBroker.ithttps://www.fda.gov/media/152162/download  This test is not yet approved or cleared by the [place] FDA and has been authorized for detection and/or diagnosis of SARS-CoV-2 by FDA under an Emergency Use Authorization (EUA). This EUA will remain in effect (meaning this test can be used) for the duration of the COVID-19 declaration under Section 564(b)(1) of the Act, 21 U.S.C. section 360bbb-3(b)(1), unless the authorization is terminated or revoked.  Performed at [facility], [address] [place], [place] [address]   Resp Panel by RT-PCR (Flu A&B, Covid) Nasopharyngeal Swab     Status: None   Collection Time: 01/19/21  3:00 AM    Specimen: Nasopharyngeal Swab; Nasopharyngeal(NP) swabs in vial transport medium  Result Value Ref Range Status   SARS Coronavirus 2 by RT PCR NEGATIVE NEGATIVE Final    Comment: (NOTE) SARS-CoV-2 target nucleic acids are NOT DETECTED.  The SARS-CoV-2 RNA is generally detectable in upper respiratory specimens during the acute phase of infection. The lowest concentration of SARS-CoV-2 viral copies this assay can detect is 138 copies/mL. A negative result does not preclude SARS-Cov-2 infection and should not be used as the sole basis for treatment or other patient management decisions. A negative result may occur with  improper specimen collection/handling, submission of specimen other than nasopharyngeal swab, presence of viral mutation(s) within the areas targeted by this assay, and inadequate number of viral copies(<138 copies/mL). A negative result must be combined with clinical observations, patient history, and epidemiological information. The expected result is Negative.  Fact Sheet for Patients:  EntrepreneurPulse.com.au  Fact Sheet for Healthcare Providers:  IncredibleEmployment.be  This test is no t yet approved or cleared by the [place] FDA and  has been authorized for detection and/or diagnosis of SARS-CoV-2 by FDA under an Emergency Use Authorization (EUA). This EUA will remain  in effect (meaning this test can be used) for the duration of the COVID-19 declaration under Section 564(b)(1) of the Act, 21 U.S.C.section 360bbb-3(b)(1), unless the authorization is terminated  or revoked sooner.       Influenza A by PCR NEGATIVE NEGATIVE Final   Influenza B by PCR NEGATIVE NEGATIVE Final    Comment: (NOTE) The Xpert Xpress SARS-CoV-2/FLU/RSV plus assay is intended as an aid in the diagnosis of influenza from Nasopharyngeal swab specimens and should not be used as a sole basis for treatment. Nasal washings and aspirates are  unacceptable for Xpert Xpress SARS-CoV-2/FLU/RSV testing.  Fact Sheet for Patients: EntrepreneurPulse.com.au  Fact Sheet for Healthcare Providers: IncredibleEmployment.be  This test is not yet approved or cleared by the [place] FDA and has been authorized for detection and/or diagnosis of SARS-CoV-2 by FDA under an Emergency Use Authorization (EUA). This EUA will remain in effect (meaning this test can be used) for the duration of the COVID-19 declaration under Section 564(b)(1) of the Act, 21 U.S.C. section 360bbb-3(b)(1), unless the authorization is terminated or revoked.  Performed at [facility], [address], [place], [place] [address]      Scheduled Meds:  influenza vac split quadrivalent PF  0.5 mL Intramuscular Tomorrow-1000   mupirocin ointment  1 application Topical BID   Continuous Infusions:  ceFEPime (MAXIPIME) IV 2 g (01/20/21 1533)   vancomycin 750 mg (01/20/21 0756)    Procedures/Studies: CT ABDOMEN PELVIS W CONTRAST  Result Date: 01/19/2021 CLINICAL DATA:  Left lower quadrant abdominal pain, history of subcutaneous injection of methamphetamine 3 weeks prior. EXAM: CT ABDOMEN AND PELVIS WITH CONTRAST TECHNIQUE: Multidetector CT imaging of the abdomen and pelvis was performed using the standard protocol following bolus administration of intravenous contrast. CONTRAST:  199mL OMNIPAQUE IOHEXOL 300 MG/ML  SOLN COMPARISON:  CT September 24, 2018 FINDINGS: Lower chest: No acute abnormality. Asymmetric soft tissue nodularity in the right breast measuring 4.1 x 1.7 cm on image 1/2. Hepatobiliary: No suspicious hepatic lesion. Gallbladder is predominantly decompressed. No biliary ductal dilation. Pancreas: No pancreatic ductal dilation or evidence of acute inflammation. Spleen: Within normal limits. Adrenals/Urinary Tract: Bilateral adrenal glands are unremarkable. No hydronephrosis. No solid enhancing renal mass. Punctate nonobstructive  right interpolar renal stone. Urinary bladder is decompressed limiting evaluation. Stomach/Bowel: Radiopaque enteric contrast material traverses the splenic flexure. Stomach is distended with ingested material without focal wall thickening. No pathologic dilation of small or large bowel. The appendix and terminal ileum appear normal. No evidence of acute bowel inflammation. Vascular/Lymphatic: No significant vascular findings are present. No pathologically enlarged abdominal or pelvic lymph nodes. Reproductive: Uterus and bilateral adnexa are unremarkable. Other: No significant abdominopelvic free fluid. Left lower anterior abdominal wall cutaneous skin defect with subjacent inflammation/edema for instance on image 66/2, without drainable/walled off fluid collection. Cutaneous skin thickening with subcutaneous edema involving the abdominal wall pannus. Moderate-sized fat containing paraumbilical hernia with some fluid  and edema in the hernia sac, possibly reactive given the adjacent inflammation in the abdominal wall pannus. 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 No acute osseous abnormality. IMPRESSION: 1. Left lower anterior abdominal wall cutaneous skin defect with subjacent inflammation/edema without drainable/walled off fluid collection. 2. Cutaneous skin thickening with subcutaneous edema involving the abdominal wall pannus. Findings which are suspicious for cellulitis. 3. Moderate-sized fat containing paraumbilical hernia with some fluid and edema in the hernia sac, possibly reflecting reactive inflammation in the hernia sac from the adjacent inflammation in the abdominal wall pannus and cutaneous skin defect. Consider clinical correlation for reducibility. 4. Nonspecific asymmetric soft tissue nodularity in the right breast measuring 4.1 x 1.7 cm. Correlation with mammography is suggested. 5. Punctate nonobstructive right renal stone. Electronically  Signed   By: Dahlia Bailiff M.D.   On: 01/19/2021 20:56    Orson Eva, DO  [facility]  If 7PM-7AM, please contact night-coverage [email or web address] Password [email or web address] 01/20/2021, 4:25 PM   LOS: 1 day

## 2021-01-21 ENCOUNTER — Telehealth: Payer: Self-pay | Admitting: Emergency Medicine

## 2021-01-21 MED ORDER — DOXYCYCLINE HYCLATE 100 MG PO TABS: 100.0000 mg | ORAL_TABLET | Freq: Two times a day (BID) | ORAL | 0 refills | Status: AC

## 2021-01-21 MED ORDER — DOXYCYCLINE HYCLATE 100 MG PO TABS
100.0000 mg | ORAL_TABLET | Freq: Two times a day (BID) | ORAL | Status: DC
  Administered 2021-01-21: 11:00:00 100 mg via ORAL
  Filled 2021-01-21: qty 1

## 2021-01-21 MED ORDER — AMOXICILLIN-POT CLAVULANATE 875-125 MG PO TABS
1.0000 | ORAL_TABLET | Freq: Two times a day (BID) | ORAL | Status: DC
  Administered 2021-01-21: 11:00:00 1 via ORAL
  Filled 2021-01-21: qty 1

## 2021-01-21 MED ORDER — AMOXICILLIN-POT CLAVULANATE 875-125 MG PO TABS: 1.0000 | ORAL_TABLET | Freq: Two times a day (BID) | ORAL | 0 refills | Status: AC

## 2021-01-21 NOTE — Progress Notes (Signed)
Subjective: Still with some pain at wound site when touched.  Objective: Vital signs in last 24 hours: Temp:  [97.5 F (36.4 C)-98.5 F (36.9 C)] 97.5 F (36.4 C) (12/04 0602) Pulse Rate:  [60-66] 66 (12/04 0602) Resp:  [18-19] 18 (12/04 0602) BP: (100-115)/(35-60) 107/44 (12/04 0602) SpO2:  [96 %-100 %] 96 % (12/04 0602) Last BM Date: 01/19/21  Intake/Output from previous day: 12/03 0701 - 12/04 0700 In: 960 [P.O.:960] Out: -  Intake/Output this shift: No intake/output data recorded.  General appearance: alert, cooperative, and no distress Skin: Wound healing well.  No significant eschars present.  Lab Results:  Recent Labs    01/19/21 0049 01/20/21 0433  WBC 8.0 8.3  HGB 11.5* 11.4*  HCT 34.8* 36.9  PLT 227 225   BMET Recent Labs    01/19/21 0049  NA 138  K 3.8  CL 101  CO2 28  GLUCOSE 97  BUN 6  CREATININE 0.46  CALCIUM 8.5*   PT/INR No results for input(s): LABPROT, INR in the last 72 hours.  Studies/Results: CT ABDOMEN PELVIS W CONTRAST  Result Date: 01/19/2021 CLINICAL DATA:  Left lower quadrant abdominal pain, history of subcutaneous injection of methamphetamine 3 weeks prior. EXAM: CT ABDOMEN AND PELVIS WITH CONTRAST TECHNIQUE: Multidetector CT imaging of the abdomen and pelvis was performed using the standard protocol following bolus administration of intravenous contrast. CONTRAST:  OMNIPAQUE IOHEXOL 300 MG/ML  SOLN COMPARISON:  CT September 24, 2018 FINDINGS: Lower chest: No acute abnormality. Asymmetric soft tissue nodularity in the right breast measuring 4.1 x 1.7 cm on image 1/2. Hepatobiliary: No suspicious hepatic lesion. Gallbladder is predominantly decompressed. No biliary ductal dilation. Pancreas: No pancreatic ductal dilation or evidence of acute inflammation. Spleen: Within normal limits. Adrenals/Urinary Tract: Bilateral adrenal glands are unremarkable. No hydronephrosis. No solid enhancing renal mass. Punctate nonobstructive right  interpolar renal stone. Urinary bladder is decompressed limiting evaluation. Stomach/Bowel: Radiopaque enteric contrast material traverses the splenic flexure. Stomach is distended with ingested material without focal wall thickening. No pathologic dilation of small or large bowel. The appendix and terminal ileum appear normal. No evidence of acute bowel inflammation. Vascular/Lymphatic: No significant vascular findings are present. No pathologically enlarged abdominal or pelvic lymph nodes. Reproductive: Uterus and bilateral adnexa are unremarkable. Other: No significant abdominopelvic free fluid. Left lower anterior abdominal wall cutaneous skin defect with subjacent inflammation/edema for instance on image 66/2, without drainable/walled off fluid collection. Cutaneous skin thickening with subcutaneous edema involving the abdominal wall pannus. Moderate-sized fat containing paraumbilical hernia with some fluid and edema in the hernia sac, possibly reactive given the adjacent inflammation in the abdominal wall pannus. 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 No acute osseous abnormality. IMPRESSION: 1. Left lower anterior abdominal wall cutaneous skin defect with subjacent inflammation/edema without drainable/walled off fluid collection. 2. Cutaneous skin thickening with subcutaneous edema involving the abdominal wall pannus. Findings which are suspicious for cellulitis. 3. Moderate-sized fat containing paraumbilical hernia with some fluid and edema in the hernia sac, possibly reflecting reactive inflammation in the hernia sac from the adjacent inflammation in the abdominal wall pannus and cutaneous skin defect. Consider clinical correlation for reducibility. 4. Nonspecific asymmetric soft tissue nodularity in the right breast measuring 4.1 x 1.7 cm. Correlation with mammography is suggested. 5. Punctate nonobstructive right renal stone. Electronically Signed    By: Maudry Mayhew M.D.   On: 01/19/2021 20:56    Anti-infectives: Anti-infectives (From admission, onward)    Start  Dose/Rate Route Frequency Ordered Stop   01/19/21 1615  ceFEPIme (MAXIPIME) 2 g in sodium chloride 0.9 % 100 mL IVPB        2 g 200 mL/hr over 30 Minutes Intravenous Every 8 hours 01/19/21 1524     01/19/21 0800  vancomycin (VANCOREADY) IVPB 750 mg/150 mL        750 mg 150 mL/hr over 60 Minutes Intravenous Every 12 hours 01/19/21 0659 01/22/21 1959   01/19/21 0730  vancomycin (VANCOREADY) IVPB 1000 mg/200 mL  Status:  Discontinued        1,000 mg 200 mL/hr over 60 Minutes Intravenous  Once 01/19/21 0642 01/19/21 0645   01/19/21 0045  vancomycin (VANCOREADY) IVPB 1500 mg/300 mL        1,500 mg 150 mL/hr over 120 Minutes Intravenous  Once 01/19/21 0030 01/19/21 0551   01/19/21 0045  piperacillin-tazobactam (ZOSYN) IVPB 3.375 g        3.375 g 12.5 mL/hr over 240 Minutes Intravenous  Once 01/19/21 0030 01/19/21 0206       Assessment/Plan: Impression: Cellulitis with wound, abdominal wall.  Healing well. Plan: Okay for discharge from surgery standpoint as patient does not need surgical intervention at this time.  I will be more than happy to follow this wound as an outpatient.  If she is discharged, she should clean the wound twice a day with soap and water and apply Bactroban.  Would continue p.o. antibiotics for 7 to 10 days.  LOS: 2 days    Franky Macho 01/21/2021

## 2021-01-21 NOTE — Telephone Encounter (Signed)
Post ED Visit - Positive Culture Follow-up  Culture report reviewed by antimicrobial stewardship pharmacist: [facility] []  , Pharm.D. []  Enzo Bi, Pharm.D., BCPS AQ-ID []  , Pharm.D., BCPS []  Celedonio Miyamoto, .D., BCPS []  [place], .D., BCPS, AAHIVP []  Georgina Pillion, Pharm.D., BCPS, AAHIVP []  [address], PharmD, BCPS []  , PharmD, BCPS []  [place], PharmD, BCPS [x]  [address], PharmD []  , PharmD, BCPS []  Estella Husk, PharmD  Pharmacy Team []  Lysle Pearl, PharmD []  , PharmD []  Phillips Climes, PharmD []  , Rph []  Agapito Games) , PharmD []  Delmar Landau, PharmD []  , PharmD []  Mervyn Gay, PharmD []  , PharmD []  Vinnie Level, PharmD []  [facility], PharmD []  , PharmD []  Len Childs, PharmD   Positive aerobic culture Treated with Augmentin, organism sensitive to the same and no further patient follow-up is required at this time.  Atalaya Zappia 01/21/2021, 6:15 PM

## 2021-01-21 NOTE — Discharge Summary (Signed)
Physician Discharge Summary  Barbara Thompson WUJ:[id number] DOB: 03-02-1980 DOA: 01/18/2021  PCP: Barbara Molt, MD  Admit date: 01/18/2021 Discharge date: 01/21/2021  Admitted From: Home Disposition:  Home   Recommendations for Outpatient Follow-up:  Follow up with PCP in 1-2 weeks Please obtain BMP/CBC in one week   Discharge Condition: Stable CODE STATUS: FULL Diet recommendation: Regular   Brief/Interim Summary: 40 year old female with a history of methamphetamine and cocaine use and no other chronic documented medical problems presenting with left lower quadrant abdominal pain.  The patient states that she injected her left lower quadrant abdomen with methamphetamine about 3 weeks prior to this admission.  She states that about 2 weeks prior to the admission she noticed some erythema and induration that has gradually gotten worse.  She stated that it eventually " came to a head", and she tried to " prick" the boil with a safety plan 3 days prior to this admission.  Since then, the patient has noted increasing drainage and foul odor.  She denies any fevers, chills, chest pain, shortness breath, nausea, vomiting, diarrhea.  She has noticed increasing spreading erythema.  She denies any other sites of infection presently. In the emergency department, the patient was afebrile hemodynamically stable.  WBC 8.0, hemoglobin 11.6, platelets 200,000.  Lactic acid was 0.9.  The patient was started on vancomycin and given a dose of Zosyn.  General surgery was consulted to assist.  Discharge Diagnoses:  Abdominal wall cellulitis -CT abdomen/pelvis--Left lower anterior abdominal wall cutaneous skin defect with subjacent inflammation/edema without drainable/walled off fluid collection. -Continue vancomycin and cefepime during hospitalization -Case discussed with general surgery, Dr. Paulita Thompson management -improving drainage and erythema -d/c home with doxy and amox/clav x 1 week  more   Methamphetamine use -Cessation discussed   R-breast nodularity -incidental finding on CT -outpatient mammogram   Discharge Instructions   Allergies as of 01/21/2021   No Known Allergies      Medication List     STOP taking these medications    cyclobenzaprine 10 MG tablet Commonly known as: FLEXERIL       TAKE these medications    amoxicillin-clavulanate 875-125 MG tablet Commonly known as: AUGMENTIN Take 1 tablet by mouth every 12 (twelve) hours.   doxycycline 100 MG tablet Commonly known as: VIBRA-TABS Take 1 tablet (100 mg total) by mouth every 12 (twelve) hours.        Follow-up Information     Barbara Macho, MD. Schedule an appointment as soon as possible for a visit on 01/30/2021.   Specialty: General Surgery Why: For wound re-check Contact information: [id number] [place] [address] [phone]                No Known Allergies  Consultations: General surgery   Procedures/Studies: CT ABDOMEN PELVIS W CONTRAST  Result Date: 01/19/2021 CLINICAL DATA:  Left lower quadrant abdominal pain, history of subcutaneous injection of methamphetamine 3 weeks prior. EXAM: CT ABDOMEN AND PELVIS WITH CONTRAST TECHNIQUE: Multidetector CT imaging of the abdomen and pelvis was performed using the standard protocol following bolus administration of intravenous contrast. CONTRAST:  OMNIPAQUE IOHEXOL 300 MG/ML  SOLN COMPARISON:  CT September 24, 2018 FINDINGS: Lower chest: No acute abnormality. Asymmetric soft tissue nodularity in the right breast measuring 4.1 x 1.7 cm on image 1/2. Hepatobiliary: No suspicious hepatic lesion. Gallbladder is predominantly decompressed. No biliary ductal dilation. Pancreas: No pancreatic ductal dilation or evidence of acute inflammation. Spleen: Within normal limits. Adrenals/Urinary Tract: Bilateral adrenal glands are  unremarkable. No hydronephrosis. No solid enhancing renal mass. Punctate nonobstructive  right interpolar renal stone. Urinary bladder is decompressed limiting evaluation. Stomach/Bowel: Radiopaque enteric contrast material traverses the splenic flexure. Stomach is distended with ingested material without focal wall thickening. No pathologic dilation of small or large bowel. The appendix and terminal ileum appear normal. No evidence of acute bowel inflammation. Vascular/Lymphatic: No significant vascular findings are present. No pathologically enlarged abdominal or pelvic lymph nodes. Reproductive: Uterus and bilateral adnexa are unremarkable. Other: No significant abdominopelvic free fluid. Left lower anterior abdominal wall cutaneous skin defect with subjacent inflammation/edema for instance on image 66/2, without drainable/walled off fluid collection. Cutaneous skin thickening with subcutaneous edema involving the abdominal wall pannus. Moderate-sized fat containing paraumbilical hernia with some fluid and edema in the hernia sac, possibly reactive given the adjacent inflammation in the abdominal wall pannus. 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 No acute osseous abnormality. IMPRESSION: 1. Left lower anterior abdominal wall cutaneous skin defect with subjacent inflammation/edema without drainable/walled off fluid collection. 2. Cutaneous skin thickening with subcutaneous edema involving the abdominal wall pannus. Findings which are suspicious for cellulitis. 3. Moderate-sized fat containing paraumbilical hernia with some fluid and edema in the hernia sac, possibly reflecting reactive inflammation in the hernia sac from the adjacent inflammation in the abdominal wall pannus and cutaneous skin defect. Consider clinical correlation for reducibility. 4. Nonspecific asymmetric soft tissue nodularity in the right breast measuring 4.1 x 1.7 cm. Correlation with mammography is suggested. 5. Punctate nonobstructive right renal stone. Electronically  Signed   By: Barbara Thompson M.D.   On: 01/19/2021 20:56        Discharge Exam: Vitals:   01/20/21 2158 01/21/21 0602  BP: 115/60 (!) 107/44  Pulse: 63 66  Resp:  18  Temp:  (!) 97.5 F (36.4 C)  SpO2:  96%   Vitals:   01/20/21 1411 01/20/21 2137 01/20/21 2158 01/21/21 0602  BP: (!) 100/55 (!) 103/35 115/60 (!) 107/44  Pulse: 60 64 63 66  Resp: 19   18  Temp: 98 F (36.7 C) 98.5 F (36.9 C)  (!) 97.5 F (36.4 C)  TempSrc: Oral Oral  Oral  SpO2: 100% 100%  96%  Weight:      Height:        General: Pt is alert, awake, not in acute distress Cardiovascular: RRR, S1/S2 +, no rubs, no gallops Respiratory: CTA bilaterally, no wheezing, no rhonchi Abdominal: Soft, NT, ND, bowel sounds +; LLQ erythema without induration.  LLQ wound without necrosis Extremities: no edema, no cyanosis   The results of significant diagnostics from this hospitalization (including imaging, microbiology, ancillary and laboratory) are listed below for reference.    Significant Diagnostic Studies: CT ABDOMEN PELVIS W CONTRAST  Result Date: 01/19/2021 CLINICAL DATA:  Left lower quadrant abdominal pain, history of subcutaneous injection of methamphetamine 3 weeks prior. EXAM: CT ABDOMEN AND PELVIS WITH CONTRAST TECHNIQUE: Multidetector CT imaging of the abdomen and pelvis was performed using the standard protocol following bolus administration of intravenous contrast. CONTRAST:  OMNIPAQUE IOHEXOL 300 MG/ML  SOLN COMPARISON:  CT September 24, 2018 FINDINGS: Lower chest: No acute abnormality. Asymmetric soft tissue nodularity in the right breast measuring 4.1 x 1.7 cm on image 1/2. Hepatobiliary: No suspicious hepatic lesion. Gallbladder is predominantly decompressed. No biliary ductal dilation. Pancreas: No pancreatic ductal dilation or evidence of acute inflammation. Spleen: Within normal limits. Adrenals/Urinary Tract: Bilateral adrenal glands are unremarkable. No hydronephrosis. No solid enhancing renal  mass. Punctate nonobstructive right interpolar renal stone. Urinary bladder is decompressed limiting evaluation. Stomach/Bowel: Radiopaque enteric contrast material traverses the splenic flexure. Stomach is distended with ingested material without focal wall thickening. No pathologic dilation of small or large bowel. The appendix and terminal ileum appear normal. No evidence of acute bowel inflammation. Vascular/Lymphatic: No significant vascular findings are present. No pathologically enlarged abdominal or pelvic lymph nodes. Reproductive: Uterus and bilateral adnexa are unremarkable. Other: No significant abdominopelvic free fluid. Left lower anterior abdominal wall cutaneous skin defect with subjacent inflammation/edema for instance on image 66/2, without drainable/walled off fluid collection. Cutaneous skin thickening with subcutaneous edema involving the abdominal wall pannus. Moderate-sized fat containing paraumbilical hernia with some fluid and edema in the hernia sac, possibly reactive given the adjacent inflammation in the abdominal wall pannus. 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 No acute osseous abnormality. IMPRESSION: 1. Left lower anterior abdominal wall cutaneous skin defect with subjacent inflammation/edema without drainable/walled off fluid collection. 2. Cutaneous skin thickening with subcutaneous edema involving the abdominal wall pannus. Findings which are suspicious for cellulitis. 3. Moderate-sized fat containing paraumbilical hernia with some fluid and edema in the hernia sac, possibly reflecting reactive inflammation in the hernia sac from the adjacent inflammation in the abdominal wall pannus and cutaneous skin defect. Consider clinical correlation for reducibility. 4. Nonspecific asymmetric soft tissue nodularity in the right breast measuring 4.1 x 1.7 cm. Correlation with mammography is suggested. 5. Punctate nonobstructive right  renal stone. Electronically Signed   By: Barbara Thompson M.D.   On: 01/19/2021 20:56    Microbiology: Recent Results (from the past 240 hour(s))  Culture, blood (routine x 2)     Status: None (Preliminary result)   Collection Time: 01/17/21 10:49 PM   Specimen: Left Antecubital; Blood  Result Value Ref Range Status   Specimen Description LEFT ANTECUBITAL  Final   Special Requests   Final    BOTTLES DRAWN AEROBIC AND ANAEROBIC Blood Culture results may not be optimal due to an inadequate volume of blood received in culture bottles   Culture   Final    NO GROWTH 3 DAYS Performed at [facility], [address], [place], [place] [address]    Report Status PENDING  Incomplete  Culture, blood (routine x 2)     Status: None (Preliminary result)   Collection Time: 01/17/21 10:49 PM   Specimen: Right Antecubital; Blood  Result Value Ref Range Status   Specimen Description RIGHT ANTECUBITAL  Final   Special Requests   Final    BOTTLES DRAWN AEROBIC AND ANAEROBIC Blood Culture results may not be optimal due to an inadequate volume of blood received in culture bottles   Culture   Final    NO GROWTH 3 DAYS Performed at [facility], [address] [place], [place] [address]    Report Status PENDING  Incomplete  Aerobic Culture w Gram Stain (superficial specimen)     Status: None   Collection Time: 01/17/21 11:48 PM   Specimen: Abscess  Result Value Ref Range Status   Specimen Description   Final    ABSCESS Performed at [facility], [address], [place], [place] [address]    Special Requests   Final    NONE Performed at [facility], [address], [place], [place] [address]    Gram Stain   Final    FEW WBC PRESENT,BOTH PMN AND MONONUCLEAR ABUNDANT GRAM POSITIVE COCCI ABUNDANT GRAM NEGATIVE RODS ABUNDANT GRAM POSITIVE RODS  Culture   Final    MODERATE STREPTOCOCCUS GROUP C Beta hemolytic streptococci are predictably susceptible to penicillin and other beta lactams.  Susceptibility testing not routinely performed. WITHIN MIXED ORGANISMS Performed at [facility], [address] [place], [place] [address]    Report Status 01/20/2021 FINAL  Final  Resp Panel by RT-PCR (Flu A&B, Covid) Abscess     Status: None   Collection Time: 01/17/21 11:52 PM   Specimen: Abscess; Nasopharyngeal(NP) swabs in vial transport medium  Result Value Ref Range Status   SARS Coronavirus 2 by RT PCR NEGATIVE NEGATIVE Final    Comment: (NOTE) SARS-CoV-2 target nucleic acids are NOT DETECTED.  The SARS-CoV-2 RNA is generally detectable in upper respiratory specimens during the acute phase of infection. The lowest concentration of SARS-CoV-2 viral copies this assay can detect is 138 copies/mL. A negative result does not preclude SARS-Cov-2 infection and should not be used as the sole basis for treatment or other patient management decisions. A negative result may occur with  improper specimen collection/handling, submission of specimen other than nasopharyngeal swab, presence of viral mutation(s) within the areas targeted by this assay, and inadequate number of viral copies(<138 copies/mL). A negative result must be combined with clinical observations, patient history, and epidemiological information. The expected result is Negative.  Fact Sheet for Patients:  BloggerCourse.com  Fact Sheet for Healthcare Providers:  SeriousBroker.it  This test is no t yet approved or cleared by the [place] FDA and  has been authorized for detection and/or diagnosis of SARS-CoV-2 by FDA under an Emergency Use Authorization (EUA). This EUA will remain  in effect (meaning this test can be used) for the duration of the COVID-19 declaration under Section 564(b)(1) of the Act, 21 U.S.C.section 360bbb-3(b)(1), unless the authorization is terminated  or revoked sooner.       Influenza A by PCR NEGATIVE NEGATIVE Final   Influenza  B by PCR NEGATIVE NEGATIVE Final    Comment: (NOTE) The Xpert Xpress SARS-CoV-2/FLU/RSV plus assay is intended as an aid in the diagnosis of influenza from Nasopharyngeal swab specimens and should not be used as a sole basis for treatment. Nasal washings and aspirates are unacceptable for Xpert Xpress SARS-CoV-2/FLU/RSV testing.  Fact Sheet for Patients: BloggerCourse.com  Fact Sheet for Healthcare Providers: SeriousBroker.it  This test is not yet approved or cleared by the [place] FDA and has been authorized for detection and/or diagnosis of SARS-CoV-2 by FDA under an Emergency Use Authorization (EUA). This EUA will remain in effect (meaning this test can be used) for the duration of the COVID-19 declaration under Section 564(b)(1) of the Act, 21 U.S.C. section 360bbb-3(b)(1), unless the authorization is terminated or revoked.  Performed at [facility], [address] [place], [place] [address]   Resp Panel by RT-PCR (Flu A&B, Covid) Nasopharyngeal Swab     Status: None   Collection Time: 01/19/21  3:00 AM   Specimen: Nasopharyngeal Swab; Nasopharyngeal(NP) swabs in vial transport medium  Result Value Ref Range Status   SARS Coronavirus 2 by RT PCR NEGATIVE NEGATIVE Final    Comment: (NOTE) SARS-CoV-2 target nucleic acids are NOT DETECTED.  The SARS-CoV-2 RNA is generally detectable in upper respiratory specimens during the acute phase of infection. The lowest concentration of SARS-CoV-2 viral copies this assay can detect is 138 copies/mL. A negative result does not preclude SARS-Cov-2 infection and should not be used as the sole basis for treatment or other patient management decisions. A negative result may occur with  improper specimen collection/handling, submission of specimen other than nasopharyngeal swab, presence of viral mutation(s) within the areas targeted by this assay, and inadequate number of  viral copies(<138 copies/mL). A negative result must be combined with clinical observations, patient history, and epidemiological information. The expected result is Negative.  Fact Sheet for Patients:  BloggerCourse.com  Fact Sheet for Healthcare Providers:  SeriousBroker.it  This test is no t yet approved or cleared by the [place] FDA and  has been authorized for detection and/or diagnosis of SARS-CoV-2 by FDA under an Emergency Use Authorization (EUA). This EUA will remain  in effect (meaning this test can be used) for the duration of the COVID-19 declaration under Section 564(b)(1) of the Act, 21 U.S.C.section 360bbb-3(b)(1), unless the authorization is terminated  or revoked sooner.       Influenza A by PCR NEGATIVE NEGATIVE Final   Influenza B by PCR NEGATIVE NEGATIVE Final    Comment: (NOTE) The Xpert Xpress SARS-CoV-2/FLU/RSV plus assay is intended as an aid in the diagnosis of influenza from Nasopharyngeal swab specimens and should not be used as a sole basis for treatment. Nasal washings and aspirates are unacceptable for Xpert Xpress SARS-CoV-2/FLU/RSV testing.  Fact Sheet for Patients: BloggerCourse.com  Fact Sheet for Healthcare Providers: SeriousBroker.it  This test is not yet approved or cleared by the [place] FDA and has been authorized for detection and/or diagnosis of SARS-CoV-2 by FDA under an Emergency Use Authorization (EUA). This EUA will remain in effect (meaning this test can be used) for the duration of the COVID-19 declaration under Section 564(b)(1) of the Act, 21 U.S.C. section 360bbb-3(b)(1), unless the authorization is terminated or revoked.  Performed at [facility], [address], [place], [place] [address]      Labs: Basic Metabolic Panel: Recent Labs  Lab 01/17/21 2249 01/19/21 0049 01/19/21 0333  NA 137 138  --   K  3.0* 3.8  --   CL 101 101  --   CO2 28 28  --   GLUCOSE 106* 97  --   BUN 6 6  --   CREATININE 0.45 0.46  --   CALCIUM 8.4* 8.5*  --   MG  --   --  2.0  PHOS  --   --  3.2   Liver Function Tests: Recent Labs  Lab 01/19/21 0049  AST 11*  ALT 13  ALKPHOS 54  BILITOT 0.1*  PROT 6.4*  ALBUMIN 3.1*   No results for input(s): LIPASE, AMYLASE in the last 168 hours. No results for input(s): AMMONIA in the last 168 hours. CBC: Recent Labs  Lab 01/17/21 2249 01/19/21 0049 01/20/21 0433  WBC 8.0 8.0 8.3  NEUTROABS 4.9 5.2  --   HGB 11.6* 11.5* 11.4*  HCT 35.4* 34.8* 36.9  MCV 82.1 82.3 85.2  PLT 200 227 225   Cardiac Enzymes: No results for input(s): CKTOTAL, CKMB, CKMBINDEX, TROPONINI in the last 168 hours. BNP: Invalid input(s): POCBNP CBG: No results for input(s): GLUCAP in the last 168 hours.  Time coordinating discharge:  36 minutes  Signed:  Catarina Hartshorn, DO [facility] Pager: [phone] 01/21/2021, 11:52 AM

## 2021-01-22 LAB — CULTURE, BLOOD (ROUTINE X 2)
Culture: NO GROWTH
Culture: NO GROWTH

## 2021-04-18 ENCOUNTER — Encounter (HOSPITAL_COMMUNITY): Payer: Self-pay | Admitting: Emergency Medicine

## 2021-04-18 ENCOUNTER — Other Ambulatory Visit: Payer: Self-pay

## 2021-04-18 ENCOUNTER — Emergency Department (HOSPITAL_COMMUNITY)
Admission: EM | Admit: 2021-04-18 | Discharge: 2021-04-18 | Disposition: A | Discharge status: Home / Self Care | Payer: Medicaid Other | Attending: Emergency Medicine | Admitting: Emergency Medicine

## 2021-04-18 DIAGNOSIS — R112 Nausea with vomiting, unspecified: Secondary | ICD-10-CM | POA: Insufficient documentation

## 2021-04-18 DIAGNOSIS — Z5321 Procedure and treatment not carried out due to patient leaving prior to being seen by health care provider: Secondary | ICD-10-CM | POA: Diagnosis not present

## 2021-04-18 LAB — COMPREHENSIVE METABOLIC PANEL
ALT: 17 U/L (ref 0–44)
AST: 14 U/L — ABNORMAL LOW (ref 15–41)
Albumin: 4.2 g/dL (ref 3.5–5.0)
Alkaline Phosphatase: 60 U/L (ref 38–126)
Anion gap: 9 (ref 5–15)
BUN: 9 mg/dL (ref 6–20)
CO2: 24 mmol/L (ref 22–32)
Calcium: 9 mg/dL (ref 8.9–10.3)
Chloride: 104 mmol/L (ref 98–111)
Creatinine, Ser: 0.62 mg/dL (ref 0.44–1.00)
GFR, Estimated: >60 mL/min (ref >60)
Glucose, Bld: 97 mg/dL (ref 70–99)
Potassium: 3.4 mmol/L — ABNORMAL LOW (ref 3.5–5.1)
Sodium: 137 mmol/L (ref 135–145)
Total Bilirubin: 0.6 mg/dL (ref 0.3–1.2)
Total Protein: 7.7 g/dL (ref 6.5–8.1)

## 2021-04-18 LAB — CBC
HCT: 40.6 % (ref 36.0–46.0)
Hemoglobin: 13.3 g/dL (ref 12.0–15.0)
MCH: 26.7 pg (ref 26.0–34.0)
MCHC: 32.8 g/dL (ref 30.0–36.0)
MCV: 81.5 fL (ref 80.0–100.0)
Platelets: 214 10*3/uL (ref 150–400)
RBC: 4.98 MIL/uL (ref 3.87–5.11)
RDW: 14.8 % (ref 11.5–15.5)
WBC: 6.9 10*3/uL (ref 4.0–10.5)
nRBC: 0 % (ref 0.0–0.2)

## 2021-04-18 LAB — LIPASE, BLOOD: Lipase: 32 U/L (ref 11–51)

## 2021-04-18 MED ORDER — LACTATED RINGERS IV BOLUS: 1000.0000 mL | Freq: Once | INTRAVENOUS | Status: DC

## 2021-04-18 MED ORDER — ONDANSETRON HCL 4 MG/2ML IJ SOLN: 4.0000 mg | Freq: Once | INTRAMUSCULAR | Status: DC

## 2021-04-18 NOTE — ED Triage Notes (Signed)
Pt c/o n/v since yesterday around 1500. ?

## 2021-09-08 ENCOUNTER — Other Ambulatory Visit: Payer: Self-pay

## 2021-09-08 ENCOUNTER — Encounter (HOSPITAL_COMMUNITY): Payer: Self-pay

## 2021-09-08 ENCOUNTER — Emergency Department (HOSPITAL_COMMUNITY)
Admission: EM | Admit: 2021-09-08 | Discharge: 2021-09-09 | Discharge status: Against Medical Advice | Payer: Medicaid Other | Attending: Emergency Medicine | Admitting: Emergency Medicine

## 2021-09-08 DIAGNOSIS — R531 Weakness: Secondary | ICD-10-CM | POA: Insufficient documentation

## 2021-09-08 DIAGNOSIS — Z5329 Procedure and treatment not carried out because of patient's decision for other reasons: Secondary | ICD-10-CM | POA: Diagnosis not present

## 2021-09-08 DIAGNOSIS — R110 Nausea: Secondary | ICD-10-CM | POA: Insufficient documentation

## 2021-09-08 LAB — URINALYSIS, ROUTINE W REFLEX MICROSCOPIC
Bilirubin Urine: NEGATIVE
Glucose, UA: NEGATIVE mg/dL
Hgb urine dipstick: NEGATIVE
Ketones, ur: NEGATIVE mg/dL
Leukocytes,Ua: NEGATIVE
Nitrite: NEGATIVE
Protein, ur: NEGATIVE mg/dL
Specific Gravity, Urine: 1.013 (ref 1.005–1.030)
pH: 7 (ref 5.0–8.0)

## 2021-09-08 LAB — PREGNANCY, URINE: Preg Test, Ur: NEGATIVE

## 2021-09-08 NOTE — ED Notes (Signed)
Ambulatory to tx room  

## 2021-09-08 NOTE — ED Triage Notes (Signed)
Pt arrived from home via POV  w c/o period being late 9 days, feeling weak and nauseated

## 2021-09-09 MED ORDER — LACTATED RINGERS IV BOLUS: 1000.0000 mL | Freq: Once | INTRAVENOUS | Status: DC

## 2021-09-09 MED ORDER — ONDANSETRON HCL 4 MG/2ML IJ SOLN
4.0000 mg | Freq: Once | INTRAMUSCULAR | Status: DC
  Filled 2021-09-09: qty 2

## 2021-09-09 NOTE — ED Provider Notes (Signed)
[facility] EMERGENCY DEPARTMENT Provider Note   CSN: [id number] Arrival date & time: 09/08/21  2239     History  Chief Complaint  Patient presents with   multiple complaints    Barbara Thompson is a 41 y.o. female.  The history is provided by the patient.  She has history of gestational diabetes and comes in complaining of nausea for the last 2 weeks.  She also states that she is about 2 weeks late for her menses.  She did have some slight spotting the end of June which is sooner than she would have expected to have her period.  She states that she has been able to eat and drink most days, but today she has not been able to eat.  She did vomit once.  She has not done anything to treat her symptoms.  She denies any sick contacts.   Home Medications Prior to Admission medications   Medication Sig Start Date End Date Taking? Authorizing Provider  amoxicillin-clavulanate (AUGMENTIN) 875-125 MG tablet Take 1 tablet by mouth every 12 (twelve) hours. 01/21/21   Catarina Hartshorn, MD  doxycycline (VIBRA-TABS) 100 MG tablet Take 1 tablet (100 mg total) by mouth every 12 (twelve) hours. 01/21/21   Catarina Hartshorn, MD      Allergies    Patient has no known allergies.    Review of Systems   Review of Systems  All other systems reviewed and are negative.   Physical Exam Updated Vital Signs BP (!) 166/91 (BP Location: Right Arm)   Pulse 91   Temp 97.8 F (36.6 C) (Oral)   Resp 17   Ht 5\' 1"  (1.549 m)   Wt 86.6 kg   SpO2 98%   BMI 36.09 kg/m  Physical Exam Vitals and nursing note reviewed.   41 year old female, resting comfortably and in no acute distress. Vital signs are significant for elevated blood pressure. Oxygen saturation is 98%, which is normal. Head is normocephalic and atraumatic. PERRLA, EOMI. Oropharynx is clear. Neck is nontender and supple without adenopathy or JVD. Back is nontender and there is no CVA tenderness. Lungs are clear without rales, wheezes, or rhonchi. Chest is  nontender. Heart has regular rate and rhythm without murmur. Abdomen is soft, flat, nontender. Extremities have no cyanosis or edema, full range of motion is present. Skin is warm and dry without rash. Neurologic: Mental status is normal, cranial nerves are intact, moves all extremities equally.  ED Results / Procedures / Treatments   Labs (all labs ordered are listed, but only abnormal results are displayed) Labs Reviewed  URINALYSIS, ROUTINE W REFLEX MICROSCOPIC - Abnormal; Notable for the following components:      Result Value   Color, Urine STRAW (*)    All other components within normal limits  URINE CULTURE  PREGNANCY, URINE   Procedures Procedures   Medications Ordered in ED Medications  ondansetron (ZOFRAN) injection 4 mg (has no administration in time range)  lactated ringers bolus 1,000 mL (has no administration in time range)    ED Course/ Medical Decision Making/ A&P                           Medical Decision Making  Nausea and weakness, etiology unclear.  Consider viral illness such as hepatitis, infectious mononucleosis.  Consider pregnancy.  I have reviewed her urine studies, and my interpretation is no evidence of pregnancy, no evidence of UTI.  I have ordered laboratory testing of  CBC and comprehensive metabolic panel.  I have also ordered intravenous fluids and ondansetron.  Apparently, above-noted labs were never drawn.  Urinalysis was sent and I have independently viewed and interpreted the results and my interpretation is normal urinalysis.  Patient eloped before labs could be drawn before I could talk with her.  Final Clinical Impression(s) / ED Diagnoses Final diagnoses:  Nausea    Rx / DC Orders ED Discharge Orders     None         Dione Booze, MD 09/09/21 [phone]

## 2021-09-09 NOTE — ED Notes (Signed)
Pt did not want to have IV or medications. Signed ama. States she has to work in the am

## 2021-09-10 LAB — URINE CULTURE: Culture: NO GROWTH

## 2021-09-29 IMAGING — CT CT RENAL STONE PROTOCOL
2 of 6 series · 11 of 46 positions shown, 12 images · non-contrast
Comparison: None.

CLINICAL DATA: Right flank pain for 2 days with dark urine. Kidney
stone suspected.

EXAM:
CT ABDOMEN AND PELVIS WITHOUT CONTRAST
TECHNIQUE: Multidetector CT imaging of the abdomen and pelvis was performed
following the standard protocol without IV contrast.

[Series 2: axial st · axial · 0.70mm/px · z∈[+766,+1156]mm · 8 of 92 slices shown, 9 images]
[im 7/92  soft-tissue]
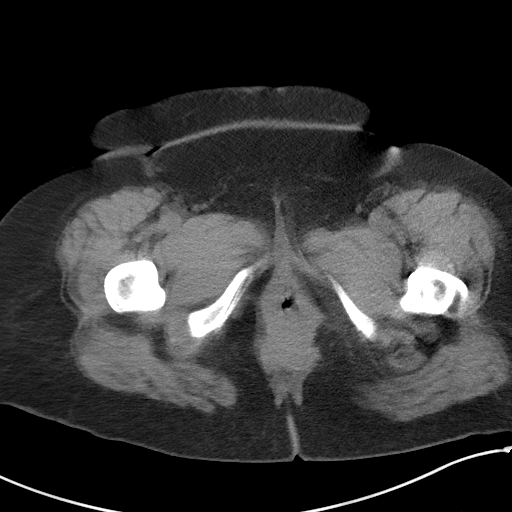
[im 7/92  bone]
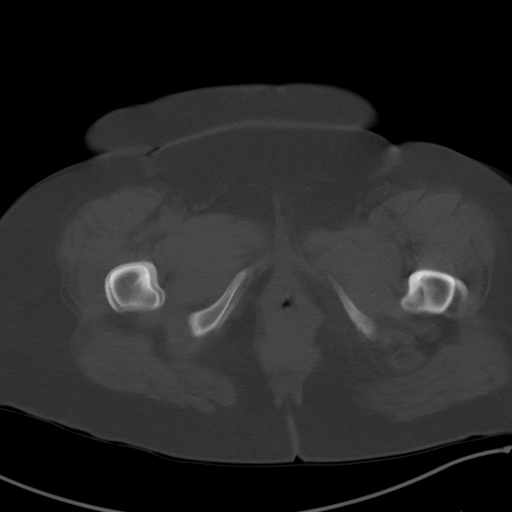
[im 20/92  soft-tissue]
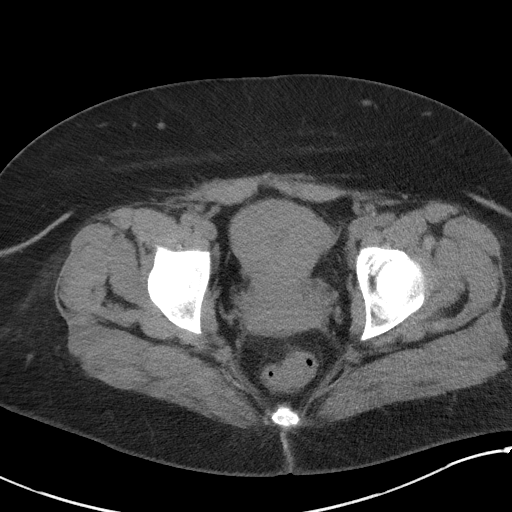
[im 33/92  soft-tissue]
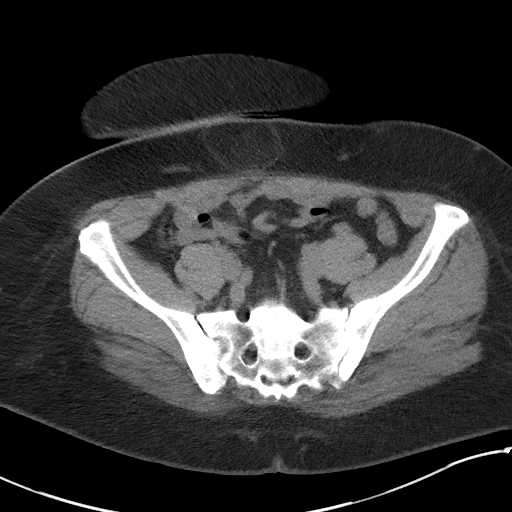
[im 40/92  soft-tissue]
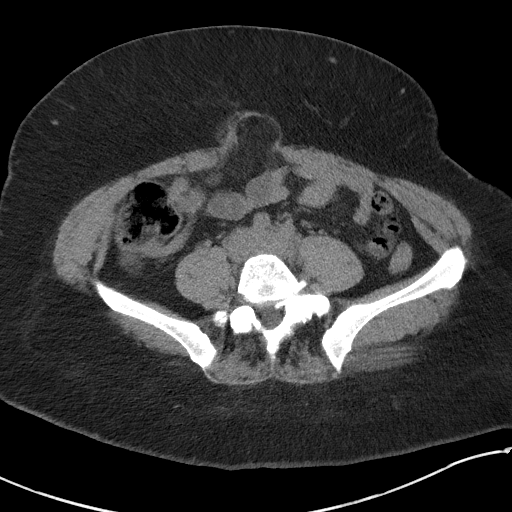
[im 53/92  soft-tissue]
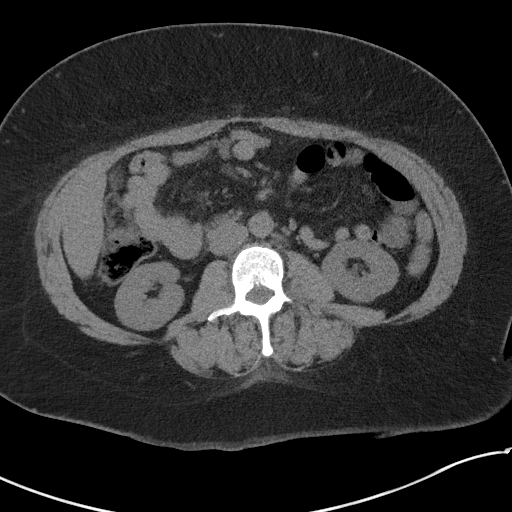
[im 59/92  soft-tissue]
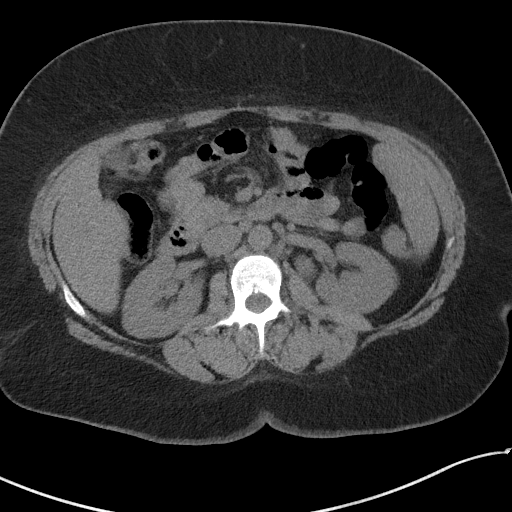
[im 72/92  soft-tissue]
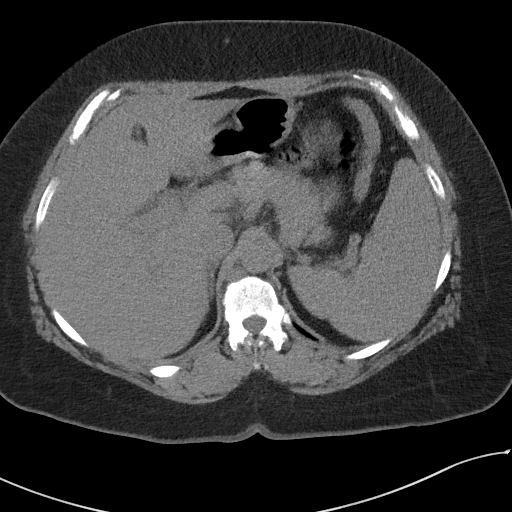
[im 85/92  soft-tissue]
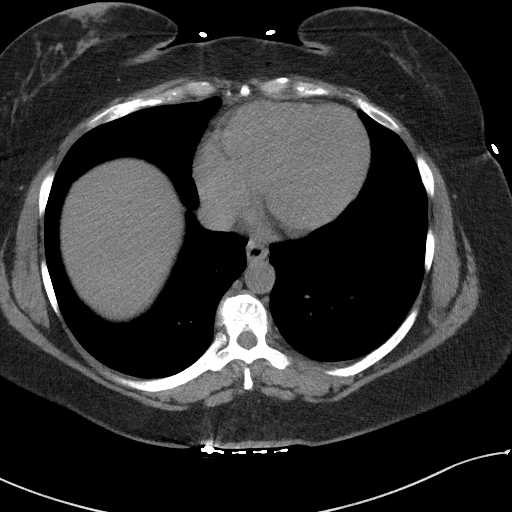

[Series 10: coronal st · coronal · 0.28mm/px · 3 of 101 slices shown]
[im 34/101  soft-tissue]
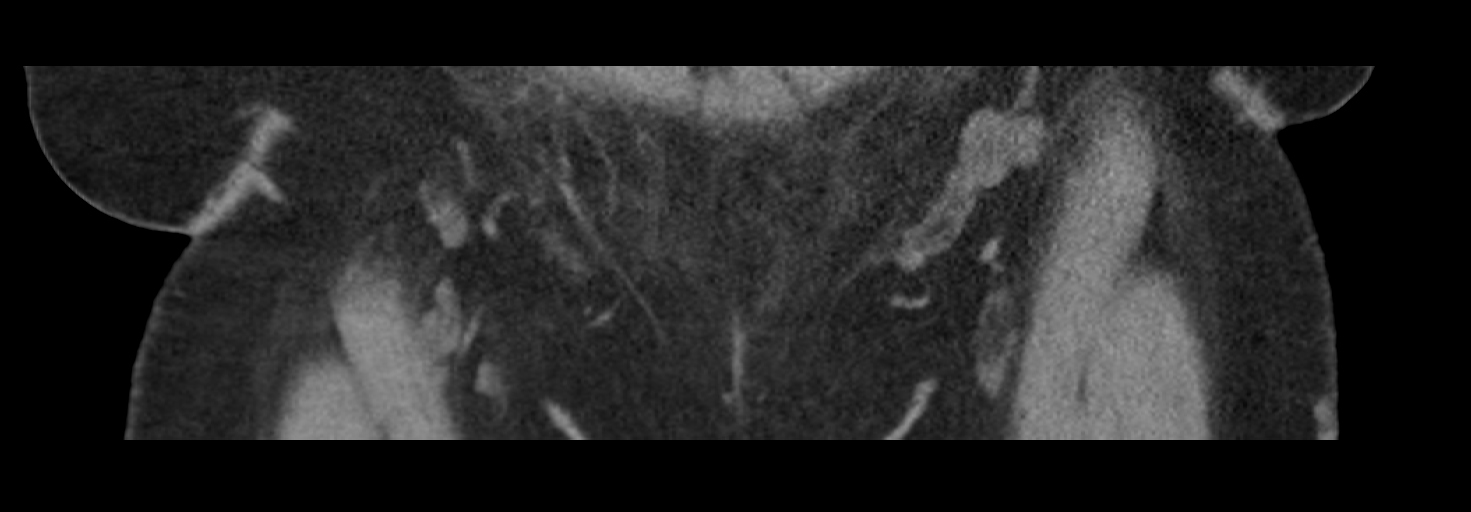
[im 45/101  soft-tissue]
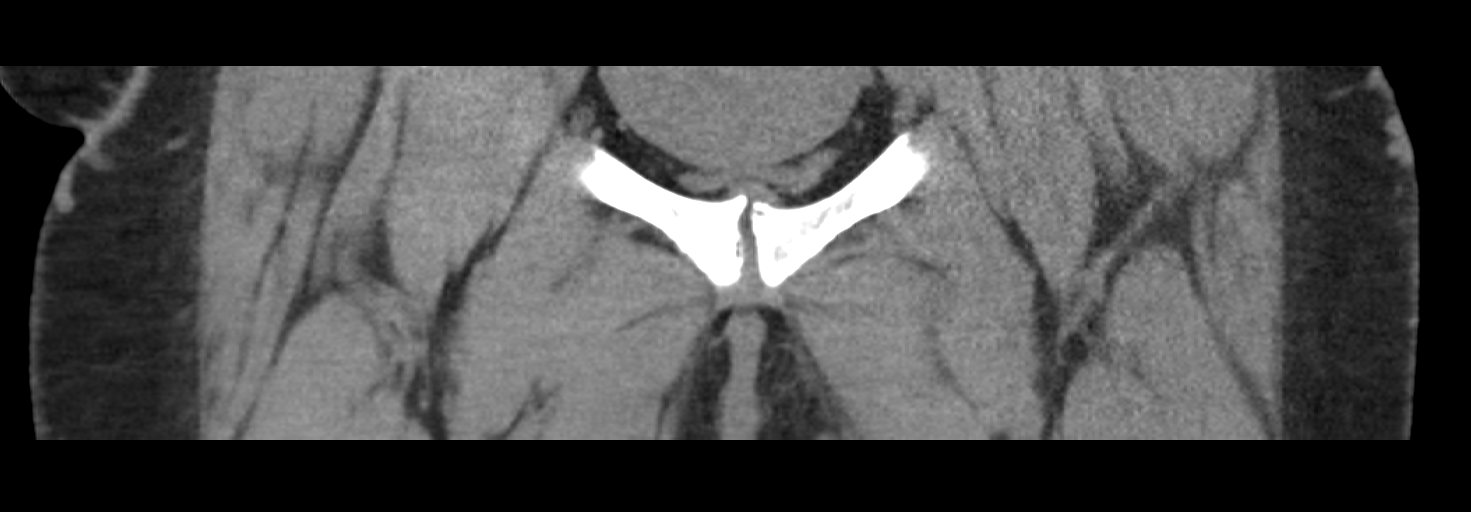
[im 56/101  soft-tissue]
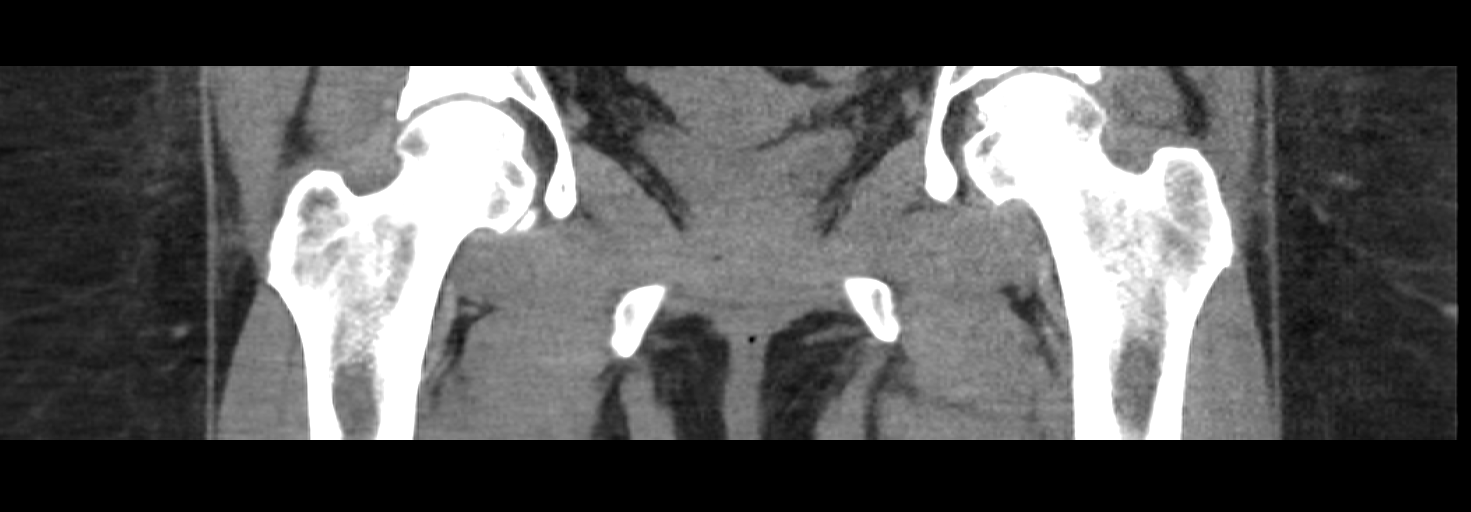

[11 of 46 positions shown; findings below may reference images not displayed]

FINDINGS: Lower chest: Clear lung bases. No significant pleural or pericardial
effusion.

Hepatobiliary: The liver appears unremarkable as imaged in the
noncontrast state. No evidence of gallstones, gallbladder wall
thickening or biliary dilatation.

Pancreas: Unremarkable. No pancreatic ductal dilatation or
surrounding inflammatory changes.

Spleen: Normal in size without focal abnormality.

Adrenals/Urinary Tract: Both adrenal glands appear normal. Possible
punctate nonobstructing calculus in the interpolar region of the
right kidney, best seen on coronal image 58/6. No other definite
urinary tract calculi. There is no hydronephrosis or perinephric
soft tissue stranding. The bladder is empty.

Stomach/Bowel: No enteric contrast administered. The stomach appears
unremarkable for its degree of distension. No evidence of bowel wall
thickening, distention or surrounding inflammatory change.

Vascular/Lymphatic: There are no enlarged abdominal or pelvic lymph
nodes. No significant vascular findings on noncontrast imaging.

Reproductive: The uterus and ovaries appear normal. No adnexal mass.

Other: Moderate-sized umbilical hernia containing only fat. No
herniated bowel. No ascites or free air.

Musculoskeletal: No acute or significant osseous findings. There is
moderately advanced facet arthropathy bilaterally at L4-5.
IMPRESSION: 1. Possible tiny nonobstructing right renal calculus. No evidence of
ureteral calculus or hydronephrosis.
2. No apparent acute abdominal findings.
3. Moderate-sized umbilical hernia containing only fat. Lumbar facet
arthropathy.

## 2022-05-17 ENCOUNTER — Encounter (HOSPITAL_COMMUNITY): Payer: Self-pay | Admitting: Emergency Medicine

## 2022-05-17 ENCOUNTER — Other Ambulatory Visit: Payer: Self-pay

## 2022-05-17 ENCOUNTER — Emergency Department (HOSPITAL_COMMUNITY)
Admission: EM | Admit: 2022-05-17 | Discharge: 2022-05-17 | Disposition: A | Discharge status: Home / Self Care | Payer: Medicaid Other | Attending: Emergency Medicine | Admitting: Emergency Medicine

## 2022-05-17 DIAGNOSIS — X58XXXA Exposure to other specified factors, initial encounter: Secondary | ICD-10-CM | POA: Insufficient documentation

## 2022-05-17 DIAGNOSIS — T192XXA Foreign body in vulva and vagina, initial encounter: Secondary | ICD-10-CM | POA: Diagnosis present

## 2022-05-17 NOTE — ED Notes (Signed)
Pt ambulated to the bathroom and back to room unassisted. Monitored by [employer]

## 2022-05-17 NOTE — ED Triage Notes (Signed)
[employer] brought pt to the ed. Pt has cocaine inside vaginal cavity wrapped in plastic and a dollar bill.

## 2022-05-17 NOTE — Discharge Instructions (Signed)
You were evaluated in the Emergency Department and after careful evaluation, we did not find any emergent condition requiring admission or further testing in the hospital.  Your exam/testing today is overall reassuring.  Medically cleared.  Please return to the Emergency Department if you experience any worsening of your condition.   Thank you for allowing [place] to be a part of your care.

## 2022-05-17 NOTE — ED Notes (Signed)
Spoke with poison control and they are closing this pt out.

## 2022-05-17 NOTE — ED Notes (Addendum)
Pt removed 3- 1 dollar bills from vaginal cavity and threw them on the floor. This RN and [employer] officer picked up the bills.  2 of the bills had small plastic wrapped cocaine powder inside. Cocaine was not sealed. EDP to call poison control. 2 [employer] officer's at bedside. Pt is in [employer] custody. Pt handcuffed left wrist to bed

## 2022-05-17 NOTE — ED Provider Notes (Signed)
[facility] Emergency Department Provider Note MRN:  [id number]  Arrival date & time: 05/17/22     Chief Complaint   Body stuffing History of Present Illness   Barbara Thompson is a 42 y.o. year-old female with no pertinent past medical history presenting to the ED with chief complaint of body stuffing.  Brought in by police, patient reportedly stuffed cocaine into her vagina to attempt to avoid being charged with possession.  No symptoms at this time.  Review of Systems  A thorough review of systems was obtained and all systems are negative except as noted in the HPI and PMH.   Patient's Health History    Past Medical History:  Diagnosis Date   Gestational diabetes    Preeclampsia     Past Surgical History:  Procedure Laterality Date   CESAREAN SECTION      History reviewed. No pertinent family history.  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, Methamphetamines, Cocaine    Comment: meth used 1- 1/2 weeks ago (01/18/2021)   Sexual activity: Never  Other Topics Concern   Not on file  Social History Narrative   Not on file   Social Determinants of Health   Financial Resource Strain: Not on file  Food Insecurity: Not on file  Transportation Needs: Not on file  Physical Activity: Not on file  Stress: Not on file  Social Connections: Not on file  Intimate Partner Violence: Not on file     Physical Exam   Vitals:   05/17/22 0600 05/17/22 0630  BP: 122/69 125/89  Pulse: (!) 56 67  Resp: 15 19  Temp:    SpO2: 97% 100%    CONSTITUTIONAL:  Well-appearing, NAD NEURO/PSYCH:  Alert and oriented x 3, no focal deficits EYES:  eyes equal and reactive ENT/NECK:  no LAD, no JVD CARDIO: Regular rate, well-perfused, normal S1 and S2 PULM:  CTAB no wheezing or rhonchi GI/GU:  non-distended, non-tender MSK/SPINE:  No gross deformities, no edema SKIN:  no rash, atraumatic   *Additional and/or pertinent findings included in MDM below  Diagnostic and Interventional Summary    EKG Interpretation  Date/Time:    Ventricular Rate:    PR Interval:    QRS Duration:   QT Interval:    QTC Calculation:   R Axis:     Text Interpretation:         Labs Reviewed - No data to display  No orders to display    Medications - No data to display   Procedures  /  Critical Care Procedures  ED Course and Medical Decision Making  Initial Impression and Ddx Presenting with concern for body stuffing, specifically vaginal stuffing of cocaine.  During my initial evaluation patient became frustrated that the officer accompanying her began talking to me and explaining the situation.  She quickly pulled her pants down and pulled out 3 separate dollar bills from her vagina and threw them on the ground.  Inspected the dollar bills and 2 of the dollar bills contained a small amount of white powdery substance wrapped in some type of plastic.  The plastic did not seem well sealed but I see no obvious signs of cocaine or powdery residue on the dollar bills.  I suggested that we do a thorough pelvic exam to evaluate for any signs of further retained foreign body.  Patient declines at this, explains that the 2 packets of cocaine and the $3 bills was all that she put in there.  She is made aware of the risks of cocaine intoxication, cocaine overdose.  Still she declines pelvic exam.  I spoke with the poison control team over the phone.  Plan is for 4 hours of observation for any symptoms.  Normal vital signs at this time.  Past medical/surgical history that  increases complexity of ED encounter: None  Interpretation of Diagnostics Laboratory and/or imaging options to aid in the diagnosis/care of the patient were considered.  After careful history and physical examination, it was determined that there was no indication for diagnostics at this time.  Patient Reassessment and Ultimate Disposition/Management     Patient with normal vital signs and no complaints during her 4-hour observation.  Denies any other locations of body stiffening, appropriate for discharge.  Patient management required discussion with the following services or consulting groups:  None  Complexity of Problems Addressed Acute complicated illness or Injury  Additional Data Reviewed and Analyzed Further history obtained from: Police on arrival  Additional Factors Impacting ED Encounter Risk None  Barth Kirks. Sedonia Small, [place] [email or web address]  Final Clinical Impressions(s) / ED Diagnoses     ICD-10-CM   1. Foreign body in vagina, initial encounter  T19.2XXA       ED Discharge Orders     None        Discharge Instructions Discussed with and Provided to Patient:     Discharge Instructions      You were evaluated in the Emergency Department and after careful evaluation, we did not find any emergent condition requiring admission or further testing in the hospital.  Your exam/testing today is overall reassuring.  Medically cleared.  Please return to the Emergency Department if you experience any worsening of your condition.   Thank you for allowing [place] to be a part of your care.       Maudie Flakes, MD 05/17/22 [phone]
